# Patient Record
Sex: Male | Born: 1975
Health system: Southern US, Community
[De-identification: ages and names within clinical notes are randomized; demographics above are authoritative.]

## PROBLEM LIST (undated history)

## (undated) DIAGNOSIS — T7840XA Allergy, unspecified, initial encounter: Secondary | ICD-10-CM

## (undated) DIAGNOSIS — I10 Essential (primary) hypertension: Secondary | ICD-10-CM

## (undated) HISTORY — DX: Allergy, unspecified, initial encounter: T78.40XA

---

## 2014-12-10 ENCOUNTER — Emergency Department (HOSPITAL_COMMUNITY)
Admission: EM | Admit: 2014-12-10 | Discharge: 2014-12-10 | Disposition: A | Discharge status: Home / Self Care | Payer: Commercial Managed Care - PPO | Source: Home / Self Care | Attending: Family Medicine | Admitting: Family Medicine

## 2014-12-10 ENCOUNTER — Encounter (HOSPITAL_COMMUNITY): Payer: Self-pay | Admitting: Emergency Medicine

## 2014-12-10 DIAGNOSIS — S161XXA Strain of muscle, fascia and tendon at neck level, initial encounter: Secondary | ICD-10-CM | POA: Diagnosis not present

## 2014-12-10 MED ORDER — CYCLOBENZAPRINE HCL 10 MG PO TABS: 10.0000 mg | ORAL_TABLET | Freq: Every evening | ORAL | Status: DC | PRN

## 2014-12-10 MED ORDER — NAPROXEN 500 MG PO TABS: 500.0000 mg | ORAL_TABLET | Freq: Two times a day (BID) | ORAL | Status: DC

## 2014-12-10 NOTE — ED Provider Notes (Signed)
Nathan Garcia is a 39 y.o. male who presents to Urgent Care today for neck pain. Patient has a one month history of posterior neck pain. Pain started after he moved heavy furniture about a month ago. It's been worse for about a week. He's had similar pain in the past. No radiating pain weakness or numbness bowel bladder dysfunction or difficulty walking.   History reviewed. No pertinent past medical history. History reviewed. No pertinent past surgical history. History  Substance Use Topics  . Smoking status: Current Every Day Smoker -- 0.50 packs/day    Types: Cigarettes  . Smokeless tobacco: Not on file  . Alcohol Use: Yes   ROS as above Medications: No current facility-administered medications for this encounter.   Current Outpatient Prescriptions  Medication Sig Dispense Refill  . cyclobenzaprine (FLEXERIL) 10 MG tablet Take 1 tablet (10 mg total) by mouth at bedtime as needed for muscle spasms. 20 tablet 1  . naproxen (NAPROSYN) 500 MG tablet Take 1 tablet (500 mg total) by mouth 2 (two) times daily. 30 tablet 1   No Known Allergies   Exam:  BP 108/69 mmHg  Pulse 74  Temp(Src) 98.4 F (36.9 C) (Oral)  Resp 16  SpO2 98% Gen: Well NAD HEENT: EOMI,  MMM Lungs: Normal work of breathing. CTABL Heart: RRR no MRG Abd: NABS, Soft. Nondistended, Nontender Exts: Brisk capillary refill, warm and well perfused.  Neck: Nontender to spinal midline. Tender palpation bilateral trapezius and cervical paraspinals. Sliding normal neck range of motion Negative Spurling's test negative Upper extremity strength is intact throughout Reflexes are equal and intact and normal bilateral upper extremities.  No results found for this or any previous visit (from the past 24 hour(s)). No results found.  Assessment and Plan: 39 y.o. male with myofascial strain. Treat with naproxen, Flexeril, physical therapy. Follow up with sports medicine if not better.  Discussed warning signs or symptoms.  Please see discharge instructions. Patient expresses understanding.     Rodolph BongEvan S Melodye Swor, MD 12/10/14 [phone]

## 2014-12-10 NOTE — ED Notes (Signed)
C/o upper back/back of neck pain onset 1 week Reports he was moving heavy furniture Pain increases w/activity Denies weakness/numbness of extremities  Alert, no signs of acute distress.

## 2014-12-10 NOTE — Discharge Instructions (Signed)
Thank you for coming in today. Come back or go to the emergency room if you notice new weakness new numbness problems walking or bowel or bladder problems. Follow up with myself or Dr. Katrinka BlazingSmith.  Attend physical therapy.    Cervical Strain and Sprain  with Rehab Cervical strain and sprain are injuries that commonly occur with "whiplash" injuries. Whiplash occurs when the neck is forcefully whipped backward or forward, such as during a motor vehicle accident or during contact sports. The muscles, ligaments, tendons, discs, and nerves of the neck are susceptible to injury when this occurs. RISK FACTORS Risk of having a whiplash injury increases if:  Osteoarthritis of the spine.  Situations that make head or neck accidents or trauma more likely.  High-risk sports (football, rugby, wrestling, hockey, auto racing, gymnastics, diving, contact karate, or boxing).  Poor strength and flexibility of the neck.  Previous neck injury.  Poor tackling technique.  Improperly fitted or padded equipment. SYMPTOMS   Pain or stiffness in the front or back of neck or both.  Symptoms may present immediately or up to 24 hours after injury.  Dizziness, headache, nausea, and vomiting.  Muscle spasm with soreness and stiffness in the neck.  Tenderness and swelling at the injury site. PREVENTION  Learn and use proper technique (avoid tackling with the head, spearing, and head-butting; use proper falling techniques to avoid landing on the head).  Warm up and stretch properly before activity.  Maintain physical fitness:  Strength, flexibility, and endurance.  Cardiovascular fitness.  Wear properly fitted and padded protective equipment, such as padded soft collars, for participation in contact sports. PROGNOSIS  Recovery from cervical strain and sprain injuries is dependent on the extent of the injury. These injuries are usually curable in 1 week to 3 months with appropriate treatment.  RELATED  COMPLICATIONS   Temporary numbness and weakness may occur if the nerve roots are damaged, and this may persist until the nerve has completely healed.  Chronic pain due to frequent recurrence of symptoms.  Prolonged healing, especially if activity is resumed too soon (before complete recovery). TREATMENT  Treatment initially involves the use of ice and medication to help reduce pain and inflammation. It is also important to perform strengthening and stretching exercises and modify activities that worsen symptoms so the injury does not get worse. These exercises may be performed at home or with a therapist. For patients who experience severe symptoms, a soft, padded collar may be recommended to be worn around the neck.  Improving your posture may help reduce symptoms. Posture improvement includes pulling your chin and abdomen in while sitting or standing. If you are sitting, sit in a firm chair with your buttocks against the back of the chair. While sleeping, try replacing your pillow with a small towel rolled to 2 inches in diameter, or use a cervical pillow or soft cervical collar. Poor sleeping positions delay healing.  For patients with nerve root damage, which causes numbness or weakness, the use of a cervical traction apparatus may be recommended. Surgery is rarely necessary for these injuries. However, cervical strain and sprains that are present at birth (congenital) may require surgery. MEDICATION   If pain medication is necessary, nonsteroidal anti-inflammatory medications, such as aspirin and ibuprofen, or other minor pain relievers, such as acetaminophen, are often recommended.  Do not take pain medication for 7 days before surgery.  Prescription pain relievers may be given if deemed necessary by your caregiver. Use only as directed and only as much  as you need. HEAT AND COLD:   Cold treatment (icing) relieves pain and reduces inflammation. Cold treatment should be applied for 10 to 15  minutes every 2 to 3 hours for inflammation and pain and immediately after any activity that aggravates your symptoms. Use ice packs or an ice massage.  Heat treatment may be used prior to performing the stretching and strengthening activities prescribed by your caregiver, physical therapist, or athletic trainer. Use a heat pack or a warm soak. SEEK MEDICAL CARE IF:   Symptoms get worse or do not improve in 2 weeks despite treatment.  New, unexplained symptoms develop (drugs used in treatment may produce side effects). EXERCISES RANGE OF MOTION (ROM) AND STRETCHING EXERCISES - Cervical Strain and Sprain These exercises may help you when beginning to rehabilitate your injury. In order to successfully resolve your symptoms, you must improve your posture. These exercises are designed to help reduce the forward-head and rounded-shoulder posture which contributes to this condition. Your symptoms may resolve with or without further involvement from your physician, physical therapist or athletic trainer. While completing these exercises, remember:   Restoring tissue flexibility helps normal motion to return to the joints. This allows healthier, less painful movement and activity.  An effective stretch should be held for at least 20 seconds, although you may need to begin with shorter hold times for comfort.  A stretch should never be painful. You should only feel a gentle lengthening or release in the stretched tissue. STRETCH- Axial Extensors  Lie on your back on the floor. You may bend your knees for comfort. Place a rolled-up hand towel or dish towel, about 2 inches in diameter, under the part of your head that makes contact with the floor.  Gently tuck your chin, as if trying to make a "double chin," until you feel a gentle stretch at the base of your head.  Hold __________ seconds. Repeat __________ times. Complete this exercise __________ times per day.  STRETCH - Axial Extension   Stand or  sit on a firm surface. Assume a good posture: chest up, shoulders drawn back, abdominal muscles slightly tense, knees unlocked (if standing) and feet hip width apart.  Slowly retract your chin so your head slides back and your chin slightly lowers. Continue to look straight ahead.  You should feel a gentle stretch in the back of your head. Be certain not to feel an aggressive stretch since this can cause headaches later.  Hold for __________ seconds. Repeat __________ times. Complete this exercise __________ times per day. STRETCH - Cervical Side Bend   Stand or sit on a firm surface. Assume a good posture: chest up, shoulders drawn back, abdominal muscles slightly tense, knees unlocked (if standing) and feet hip width apart.  Without letting your nose or shoulders move, slowly tip your right / left ear to your shoulder until your feel a gentle stretch in the muscles on the opposite side of your neck.  Hold __________ seconds. Repeat __________ times. Complete this exercise __________ times per day. STRETCH - Cervical Rotators   Stand or sit on a firm surface. Assume a good posture: chest up, shoulders drawn back, abdominal muscles slightly tense, knees unlocked (if standing) and feet hip width apart.  Keeping your eyes level with the ground, slowly turn your head until you feel a gentle stretch along the back and opposite side of your neck.  Hold __________ seconds. Repeat __________ times. Complete this exercise __________ times per day. RANGE OF MOTION - Neck  Circles   Stand or sit on a firm surface. Assume a good posture: chest up, shoulders drawn back, abdominal muscles slightly tense, knees unlocked (if standing) and feet hip width apart.  Gently roll your head down and around from the back of one shoulder to the back of the other. The motion should never be forced or painful.  Repeat the motion 10-20 times, or until you feel the neck muscles relax and loosen. Repeat __________  times. Complete the exercise __________ times per day. STRENGTHENING EXERCISES - Cervical Strain and Sprain These exercises may help you when beginning to rehabilitate your injury. They may resolve your symptoms with or without further involvement from your physician, physical therapist, or athletic trainer. While completing these exercises, remember:   Muscles can gain both the endurance and the strength needed for everyday activities through controlled exercises.  Complete these exercises as instructed by your physician, physical therapist, or athletic trainer. Progress the resistance and repetitions only as guided.  You may experience muscle soreness or fatigue, but the pain or discomfort you are trying to eliminate should never worsen during these exercises. If this pain does worsen, stop and make certain you are following the directions exactly. If the pain is still present after adjustments, discontinue the exercise until you can discuss the trouble with your clinician. STRENGTH - Cervical Flexors, Isometric  Face a wall, standing about 6 inches away. Place a small pillow, a ball about 6-8 inches in diameter, or a folded towel between your forehead and the wall.  Slightly tuck your chin and gently push your forehead into the soft object. Push only with mild to moderate intensity, building up tension gradually. Keep your jaw and forehead relaxed.  Hold 10 to 20 seconds. Keep your breathing relaxed.  Release the tension slowly. Relax your neck muscles completely before you start the next repetition. Repeat __________ times. Complete this exercise __________ times per day. STRENGTH- Cervical Lateral Flexors, Isometric   Stand about 6 inches away from a wall. Place a small pillow, a ball about 6-8 inches in diameter, or a folded towel between the side of your head and the wall.  Slightly tuck your chin and gently tilt your head into the soft object. Push only with mild to moderate intensity,  building up tension gradually. Keep your jaw and forehead relaxed.  Hold 10 to 20 seconds. Keep your breathing relaxed.  Release the tension slowly. Relax your neck muscles completely before you start the next repetition. Repeat __________ times. Complete this exercise __________ times per day. STRENGTH - Cervical Extensors, Isometric   Stand about 6 inches away from a wall. Place a small pillow, a ball about 6-8 inches in diameter, or a folded towel between the back of your head and the wall.  Slightly tuck your chin and gently tilt your head back into the soft object. Push only with mild to moderate intensity, building up tension gradually. Keep your jaw and forehead relaxed.  Hold 10 to 20 seconds. Keep your breathing relaxed.  Release the tension slowly. Relax your neck muscles completely before you start the next repetition. Repeat __________ times. Complete this exercise __________ times per day. POSTURE AND BODY MECHANICS CONSIDERATIONS - Cervical Strain and Sprain Keeping correct posture when sitting, standing or completing your activities will reduce the stress put on different body tissues, allowing injured tissues a chance to heal and limiting painful experiences. The following are general guidelines for improved posture. Your physician or physical therapist will provide you with  any instructions specific to your needs. While reading these guidelines, remember:  The exercises prescribed by your provider will help you have the flexibility and strength to maintain correct postures.  The correct posture provides the optimal environment for your joints to work. All of your joints have less wear and tear when properly supported by a spine with good posture. This means you will experience a healthier, less painful body.  Correct posture must be practiced with all of your activities, especially prolonged sitting and standing. Correct posture is as important when doing repetitive low-stress  activities (typing) as it is when doing a single heavy-load activity (lifting). PROLONGED STANDING WHILE SLIGHTLY LEANING FORWARD When completing a task that requires you to lean forward while standing in one place for a long time, place either foot up on a stationary 2- to 4-inch high object to help maintain the best posture. When both feet are on the ground, the low back tends to lose its slight inward curve. If this curve flattens (or becomes too large), then the back and your other joints will experience too much stress, fatigue more quickly, and can cause pain.  RESTING POSITIONS Consider which positions are most painful for you when choosing a resting position. If you have pain with flexion-based activities (sitting, bending, stooping, squatting), choose a position that allows you to rest in a less flexed posture. You would want to avoid curling into a fetal position on your side. If your pain worsens with extension-based activities (prolonged standing, working overhead), avoid resting in an extended position such as sleeping on your stomach. Most people will find more comfort when they rest with their spine in a more neutral position, neither too rounded nor too arched. Lying on a non-sagging bed on your side with a pillow between your knees, or on your back with a pillow under your knees will often provide some relief. Keep in mind, being in any one position for a prolonged period of time, no matter how correct your posture, can still lead to stiffness. WALKING Walk with an upright posture. Your ears, shoulders, and hips should all line up. OFFICE WORK When working at a desk, create an environment that supports good, upright posture. Without extra support, muscles fatigue and lead to excessive strain on joints and other tissues. CHAIR:  A chair should be able to slide under your desk when your back makes contact with the back of the chair. This allows you to work closely.  The chair's height  should allow your eyes to be level with the upper part of your monitor and your hands to be slightly lower than your elbows.  Body position:  Your feet should make contact with the floor. If this is not possible, use a foot rest.  Keep your ears over your shoulders. This will reduce stress on your neck and low back. Document Released: 06/20/2005 Document Revised: 11/04/2013 Document Reviewed: 10/02/2008 [facility] Patient Information 2015 [place], [place]. This information is not intended to replace advice given to you by your health care provider. Make sure you discuss any questions you have with your health care provider.

## 2015-08-03 ENCOUNTER — Encounter (HOSPITAL_COMMUNITY): Payer: Self-pay | Admitting: Emergency Medicine

## 2015-08-03 ENCOUNTER — Emergency Department (HOSPITAL_COMMUNITY)
Admission: EM | Admit: 2015-08-03 | Discharge: 2015-08-03 | Disposition: A | Discharge status: Home / Self Care | Payer: Commercial Managed Care - PPO | Source: Home / Self Care | Attending: Family Medicine | Admitting: Family Medicine

## 2015-08-03 DIAGNOSIS — R0981 Nasal congestion: Secondary | ICD-10-CM

## 2015-08-03 DIAGNOSIS — J069 Acute upper respiratory infection, unspecified: Secondary | ICD-10-CM | POA: Diagnosis not present

## 2015-08-03 MED ORDER — AMOXICILLIN 500 MG PO CAPS: 500.0000 mg | ORAL_CAPSULE | Freq: Three times a day (TID) | ORAL | Status: DC

## 2015-08-03 MED ORDER — NYSTATIN 100000 UNIT/ML MT SUSP: 500000.0000 [IU] | Freq: Four times a day (QID) | OROMUCOSAL | Status: DC

## 2015-08-03 NOTE — Discharge Instructions (Signed)
Upper Respiratory Infection, Adult Most upper respiratory infections (URIs) are a viral infection of the air passages leading to the lungs. A URI affects the nose, throat, and upper air passages. The most common type of URI is nasopharyngitis and is typically referred to as "the common cold." URIs run their course and usually go away on their own. Most of the time, a URI does not require medical attention, but sometimes a bacterial infection in the upper airways can follow a viral infection. This is called a secondary infection. Sinus and middle ear infections are common types of secondary upper respiratory infections. Bacterial pneumonia can also complicate a URI. A URI can worsen asthma and chronic obstructive pulmonary disease (COPD). Sometimes, these complications can require emergency medical care and may be life threatening.  CAUSES Almost all URIs are caused by viruses. A virus is a type of germ and can spread from one person to another.  RISKS FACTORS You may be at risk for a URI if:   You smoke.   You have chronic heart or lung disease.  You have a weakened defense (immune) system.   You are very young or very old.   You have nasal allergies or asthma.  You work in crowded or poorly ventilated areas.  You work in health care facilities or schools. SIGNS AND SYMPTOMS  Symptoms typically develop 2-3 days after you come in contact with a cold virus. Most viral URIs last 7-10 days. However, viral URIs from the influenza virus (flu virus) can last 14-18 days and are typically more severe. Symptoms may include:   Runny or stuffy (congested) nose.   Sneezing.   Cough.   Sore throat.   Headache.   Fatigue.   Fever.   Loss of appetite.   Pain in your forehead, behind your eyes, and over your cheekbones (sinus pain).  Muscle aches.  DIAGNOSIS  Your health care provider may diagnose a URI by:  Physical exam.  Tests to check that your symptoms are not due to  another condition such as:  Strep throat.  Sinusitis.  Pneumonia.  Asthma. TREATMENT  A URI goes away on its own with time. It cannot be cured with medicines, but medicines may be prescribed or recommended to relieve symptoms. Medicines may help:  Reduce your fever.  Reduce your cough.  Relieve nasal congestion. HOME CARE INSTRUCTIONS   Take medicines only as directed by your health care provider.   Gargle warm saltwater or take cough drops to comfort your throat as directed by your health care provider.  Use a warm mist humidifier or inhale steam from a shower to increase air moisture. This may make it easier to breathe.  Drink enough fluid to keep your urine clear or pale yellow.   Eat soups and other clear broths and maintain good nutrition.   Rest as needed.   Return to work when your temperature has returned to normal or as your health care provider advises. You may need to stay home longer to avoid infecting others. You can also use a face mask and careful hand washing to prevent spread of the virus.  Increase the usage of your inhaler if you have asthma.   Do not use any tobacco products, including cigarettes, chewing tobacco, or electronic cigarettes. If you need help quitting, ask your health care provider. PREVENTION  The best way to protect yourself from getting a cold is to practice good hygiene.   Avoid oral or hand contact with people with cold   symptoms.   Wash your hands often if contact occurs.  There is no clear evidence that vitamin C, vitamin E, echinacea, or exercise reduces the chance of developing a cold. However, it is always recommended to get plenty of rest, exercise, and practice good nutrition.  SEEK MEDICAL CARE IF:   You are getting worse rather than better.   Your symptoms are not controlled by medicine.   You have chills.  You have worsening shortness of breath.  You have brown or red mucus.  You have yellow or brown nasal  discharge.  You have pain in your face, especially when you bend forward.  You have a fever.  You have swollen neck glands.  You have pain while swallowing.  You have white areas in the back of your throat. SEEK IMMEDIATE MEDICAL CARE IF:   You have severe or persistent:  Headache.  Ear pain.  Sinus pain.  Chest pain.  You have chronic lung disease and any of the following:  Wheezing.  Prolonged cough.  Coughing up blood.  A change in your usual mucus.  You have a stiff neck.  You have changes in your:  Vision.  Hearing.  Thinking.  Mood. MAKE SURE YOU:   Understand these instructions.  Will watch your condition.  Will get help right away if you are not doing well or get worse.   This information is not intended to replace advice given to you by your health care provider. Make sure you discuss any questions you have with your health care provider.   Document Released: 12/14/2000 Document Revised: 11/04/2014 Document Reviewed: 09/25/2013 [employer] Interactive Patient Education 2016 [employer]  

## 2015-08-03 NOTE — ED Provider Notes (Signed)
CSN: [id number]     Arrival date & time 08/03/15  1349 History   First MD Initiated Contact with Patient 08/03/15 1530     Chief Complaint  Patient presents with  . Sinus Problem   (Consider location/radiation/quality/duration/timing/severity/associated sxs/prior Treatment) HPI  History reviewed. No pertinent past medical history. History reviewed. No pertinent past surgical history. History reviewed. No pertinent family history. Social History  Substance Use Topics  . Smoking status: Current Every Day Smoker -- 0.50 packs/day    Types: Cigarettes  . Smokeless tobacco: None  . Alcohol Use: Yes    Review of Systems  Allergies  Review of patient's allergies indicates no known allergies.  Home Medications   Prior to Admission medications   Medication Sig Start Date End Date Taking? Authorizing Provider  amoxicillin (AMOXIL) 500 MG capsule Take 1 capsule (500 mg total) by mouth 3 (three) times daily. 08/03/15   Tharon Aquas, PA  cyclobenzaprine (FLEXERIL) 10 MG tablet Take 1 tablet (10 mg total) by mouth at bedtime as needed for muscle spasms. 12/10/14   Rodolph Bong, MD  naproxen (NAPROSYN) 500 MG tablet Take 1 tablet (500 mg total) by mouth 2 (two) times daily. 12/10/14   Rodolph Bong, MD   Meds Ordered and Administered this Visit  Medications - No data to display  BP 141/95 mmHg  Pulse 76  Temp(Src) 98.2 F (36.8 C) (Oral)  Resp 16  SpO2 100% No data found.   Physical Exam  Constitutional: He is oriented to person, place, and time. He appears well-developed and well-nourished. No distress.  HENT:  Head: Normocephalic and atraumatic.  Right Ear: External ear normal.  Left Ear: External ear normal.  Mouth/Throat: Oropharynx is clear and moist.  Eyes: Conjunctivae are normal.  Neck: Normal range of motion. Neck supple.  Cardiovascular: Normal rate.   Pulmonary/Chest: Effort normal and breath sounds normal.  Abdominal: Soft. Bowel sounds are normal.   Musculoskeletal: Normal range of motion.  Neurological: He is alert and oriented to person, place, and time.  Skin: Skin is warm and dry.  Psychiatric: He has a normal mood and affect. His behavior is normal. Judgment and thought content normal.  Nursing note and vitals reviewed.   ED Course  Procedures (including critical care time)  Labs Review Labs Reviewed - No data to display  Imaging Review No results found.   Visual Acuity Review  Right Eye Distance:   Left Eye Distance:   Bilateral Distance:    Right Eye Near:   Left Eye Near:    Bilateral Near:         MDM   1. Acute URI   2. Sinus congestion    Patient is advised to continue home symptomatic treatment. Prescription for amoxicillin sent pharmacy patient has indicated. Patient is advised that if there are new or worsening symptoms or attend the emergency department, or contact primary care provider. Instructions of care provided discharged home in stable condition. Return to work note provided.  THIS NOTE WAS GENERATED USING A VOICE RECOGNITION SOFTWARE PROGRAM. ALL REASONABLE EFFORTS  WERE MADE TO PROOFREAD THIS DOCUMENT FOR ACCURACY.     Tharon Aquas, PA 08/03/15 [phone]

## 2015-08-03 NOTE — ED Notes (Signed)
Pt has been suffering from sinus congestion and pain since last Thursday.  Pt had a fever for three days.  Pt now with a cough, sinus congestion, and ear fullness.

## 2016-12-16 ENCOUNTER — Ambulatory Visit (HOSPITAL_COMMUNITY)
Admission: EM | Admit: 2016-12-16 | Discharge: 2016-12-16 | Disposition: A | Discharge status: Home / Self Care | Payer: Commercial Managed Care - PPO | Attending: Internal Medicine | Admitting: Internal Medicine

## 2016-12-16 ENCOUNTER — Encounter (HOSPITAL_COMMUNITY): Payer: Self-pay | Admitting: *Deleted

## 2016-12-16 DIAGNOSIS — L02411 Cutaneous abscess of right axilla: Secondary | ICD-10-CM | POA: Diagnosis present

## 2016-12-16 DIAGNOSIS — F17210 Nicotine dependence, cigarettes, uncomplicated: Secondary | ICD-10-CM | POA: Insufficient documentation

## 2016-12-16 DIAGNOSIS — L03111 Cellulitis of right axilla: Secondary | ICD-10-CM | POA: Diagnosis not present

## 2016-12-16 MED ORDER — CEPHALEXIN 500 MG PO CAPS: 500.0000 mg | ORAL_CAPSULE | Freq: Four times a day (QID) | ORAL | 0 refills | Status: DC

## 2016-12-16 MED ORDER — SULFAMETHOXAZOLE-TRIMETHOPRIM 800-160 MG PO TABS: 1.0000 | ORAL_TABLET | Freq: Two times a day (BID) | ORAL | 0 refills | Status: AC

## 2016-12-16 NOTE — ED Triage Notes (Signed)
Noticed   A  Bump   Under  Armpit    It  Is  Red   And  Tender  To  The  Touch     Noticed   2  Days  Ago

## 2016-12-16 NOTE — ED Provider Notes (Signed)
CSN: [id number]     Arrival date & time 12/16/16  1210 History   First MD Initiated Contact with Patient 12/16/16 1235     Chief Complaint  Patient presents with  . Abscess   (Consider location/radiation/quality/duration/timing/severity/associated sxs/prior Treatment) 41-year-old male states he noticed a small tender bump to the left right axilla 2 days ago. During a time it is increased in size substantially and is now tender There is also noticed detaining is erythema extending beyond the axilla to the anterior right shoulder. There is been no draining or bleeding.       History reviewed. No pertinent past medical history. History reviewed. No pertinent surgical history. History reviewed. No pertinent family history. Social History  Substance Use Topics  . Smoking status: Current Every Day Smoker    Packs/day: 0.50    Types: Cigarettes  . Smokeless tobacco: Not on file  . Alcohol use Yes    Review of Systems  Constitutional: Negative.   Gastrointestinal: Negative.   Musculoskeletal: Negative.   Skin:       As per history of present illness  Neurological: Negative.   All other systems reviewed and are negative.   Allergies  Patient has no known allergies.  Home Medications   Prior to Admission medications   Medication Sig Start Date End Date Taking? Authorizing Provider  cephALEXin (KEFLEX) 500 MG capsule Take 1 capsule (500 mg total) by mouth 4 (four) times daily. 12/16/16   Hayden Rasmussen, NP  sulfamethoxazole-trimethoprim (BACTRIM DS,SEPTRA DS) 800-160 MG tablet Take 1 tablet by mouth 2 (two) times daily. 12/16/16 12/23/16  Hayden Rasmussen, NP   Meds Ordered and Administered this Visit  Medications - No data to display  BP (!) 160/14 (BP Location: Right Arm)   Pulse 78   Temp 98.7 F (37.1 C) (Oral)   Resp 18   SpO2 100%  No data found.   Physical Exam  Constitutional: He is oriented to person, place, and time. He appears well-developed and well-nourished. No  distress.  Neck: Neck supple.  Cardiovascular: Normal rate.   Pulmonary/Chest: Effort normal.  Lymphadenopathy:    He has no cervical adenopathy.  Neurological: He is alert and oriented to person, place, and time.  Skin: Skin is warm and dry.  Visit approximately 1 cm x 3 cm elongated area of firmness and tenderness with underlying induration extending proximally half a centimeter on each side. Cutaneous erythema extends away from the wound toward the anterior shoulder.  Psychiatric: He has a normal mood and affect.  Nursing note and vitals reviewed.   Urgent Care Course   the right abscess lesion was prepped with Betadine. Anesthesia was cold/freeze spray. A single puncture wound by 11 blade was created. It manual expression revealed a small amount of pus that was cultured. Following that was a small amount of blood. The area was cleaned again with Betadine and a dressing applied.  Procedures (including critical care time)  Labs Review Labs Reviewed  AEROBIC CULTURE (SUPERFICIAL SPECIMEN)    Imaging Review No results found.   Visual Acuity Review  Right Eye Distance:   Left Eye Distance:   Bilateral Distance:    Right Eye Near:   Left Eye Near:    Bilateral Near:         MDM   1. Abscess of axilla, right   2. Cellulitis of axilla, right    You may keep the area clean under a shower with soap and water. Be sure to dry the  area well. Apply warm compresses 3-4 times a day if possible for 20-30 minutes at a time. There will be some bleeding or drainage for the next couple days. Take the antibiotics as directed. For any worsening, the abscess getting larger, more painful or the redness is spreading return promptly. Dressing applied Culture pending Meds ordered this encounter  Medications  . sulfamethoxazole-trimethoprim (BACTRIM DS,SEPTRA DS) 800-160 MG tablet    Sig: Take 1 tablet by mouth 2 (two) times daily.    Dispense:  14 tablet    Refill:  0    Order  Specific Question:   Supervising Provider    Answer:   Eustace MooreMURRAY, LAURA W [[id number]]  . cephALEXin (KEFLEX) 500 MG capsule    Sig: Take 1 capsule (500 mg total) by mouth 4 (four) times daily.    Dispense:  28 capsule    Refill:  0    Order Specific Question:   Supervising Provider    Answer:   Eustace MooreMURRAY, LAURA W [[id number]]       Hayden RasmussenMabe, Rapheal Masso, NP 12/16/16 1309    Hayden RasmussenMabe, Nahsir Venezia, NP 12/16/16 1315

## 2016-12-16 NOTE — Discharge Instructions (Signed)
You may keep the area clean under a shower with soap and water. Be sure to dry the area well. Apply warm compresses 3-4 times a day if possible for 20-30 minutes at a time. There will be some bleeding or drainage for the next couple days. Take the antibiotics as directed. For any worsening, the abscess getting larger, more painful or the redness is spreading return promptly.

## 2016-12-18 LAB — AEROBIC CULTURE  (SUPERFICIAL SPECIMEN)

## 2016-12-18 LAB — AEROBIC CULTURE W GRAM STAIN (SUPERFICIAL SPECIMEN)

## 2016-12-21 ENCOUNTER — Encounter: Payer: Self-pay | Admitting: Osteopathic Medicine

## 2016-12-21 ENCOUNTER — Ambulatory Visit (INDEPENDENT_AMBULATORY_CARE_PROVIDER_SITE_OTHER): Payer: Commercial Managed Care - PPO | Admitting: Osteopathic Medicine

## 2016-12-21 VITALS — BP 150/84 | HR 109 | Temp 98.9°F | Ht 74.0 in | Wt 298.0 lb

## 2016-12-21 DIAGNOSIS — I10 Essential (primary) hypertension: Secondary | ICD-10-CM | POA: Diagnosis not present

## 2016-12-21 DIAGNOSIS — L02411 Cutaneous abscess of right axilla: Secondary | ICD-10-CM

## 2016-12-21 MED ORDER — LOSARTAN POTASSIUM 25 MG PO TABS: 25.0000 mg | ORAL_TABLET | Freq: Every day | ORAL | 1 refills | Status: DC

## 2016-12-21 NOTE — Patient Instructions (Addendum)
Plan:  Starting low-dose BP medication   Goal BP 130/80 or less  Labs as well for baseline and check cholesterol, other routine  Will plan on labs in another few weeks to monitor on meds

## 2016-12-21 NOTE — Progress Notes (Signed)
HPI: Nathan Garcia is a 41 y.o. male  who presents to [facility] today, 12/21/16,  for chief complaint of:  Chief Complaint  Patient presents with  . Establish Care    FOLLOW UP FROM URGENT CARE ABCESS UNDER RIGHT ARM    Records reviewed from urgent care: Seen 5 days ago, treated for right axillary abscess and cellulitis , incision and drainage was performed, patient was treated with Bactrim and Keflex. Culture was positive for moderate white blood cells predominantly PMN, few actinomyces species.   Hypertension: Blood pressure in urgent care was systolic 160, diastolic is listed as 14 and I assume this is a typographic error, ot thinks was 110. Elevated today on recheck as well. Looks like has been trending up over the past 2 years or so. Patient reports weight gain over that time, evening cocktails and social drinking conservative estimate about 12 drinks per week, current smoker.    Past medical, surgical, social and family history reviewed: There are no active problems to display for this patient.  History reviewed. No pertinent surgical history. Social History  Substance Use Topics  . Smoking status: Current Every Day Smoker    Packs/day: 0.50    Types: Cigarettes  . Smokeless tobacco: Never Used  . Alcohol use Yes   History reviewed. No pertinent family history.   Current medication list and allergy/intolerance information reviewed:   Current Outpatient Prescriptions  Medication Sig Dispense Refill  . cephALEXin (KEFLEX) 500 MG capsule Take 1 capsule (500 mg total) by mouth 4 (four) times daily. 28 capsule 0  . sulfamethoxazole-trimethoprim (BACTRIM DS,SEPTRA DS) 800-160 MG tablet Take 1 tablet by mouth 2 (two) times daily. 14 tablet 0   No current facility-administered medications for this visit.    No Known Allergies    Review of Systems:  Constitutional:  No  fever, no chills, No recent illness, No unintentional weight changes.  No significant fatigue.   HEENT: No  headache, no vision change, no hearing change, No sore throat, No  sinus pressure  Cardiac: No  chest pain, No  pressure, No palpitations, No  Orthopnea  Respiratory:  No  shortness of breath. No  Cough  Gastrointestinal: No  abdominal pain, No  nausea, No  vomiting,  No  blood in stool, No  diarrhea, No  constipation   Musculoskeletal: No new myalgia/arthralgia  Genitourinary: No  incontinence  Skin: No  Rash, No other wounds/concerning lesions  Hem/Onc: No  easy bruising/bleeding  Endocrine: No cold intolerance,  No heat intolerance  Neurologic: No  weakness, No  dizziness  Psychiatric: No  concerns with depression, No  concerns with anxiety, No sleep problems, No mood problems  Exam:  BP (!) 150/84   Pulse (!) 109   Temp 98.9 F (37.2 C) (Oral)   Ht 6\' 2"  (1.88 m)   Wt 298 lb (135.2 kg)   BMI 38.26 kg/m   Constitutional: VS see above. General Appearance: alert, well-developed, well-nourished, NAD  Eyes: Normal lids and conjunctive, non-icteric sclera  Ears, Nose, Mouth, Throat: MMM, Normal external inspection ears/nares/mouth/lips/gums. TM normal bilaterally. Pharynx/tonsils no erythema, no exudate. Nasal mucosa normal.   Neck: No masses, trachea midline. No thyroid enlargement. No tenderness/mass appreciated. No lymphadenopathy  Respiratory: Normal respiratory effort. no wheeze, no rhonchi, no rales  Cardiovascular: S1/S2 normal, no murmur, no rub/gallop auscultated. RRR on ausculatation. No lower extremity edema.   Musculoskeletal: Gait normal. No clubbing/cyanosis of digits.   Neurological: Normal balance/coordination. No tremor.  Skin: warm, dry, intact. Right axilla demonstrates small indurated lesion with healing laceration consistent with previous incision and drainage. No significant erythema/fluctuance/tenderness.  Psychiatric: Normal judgment/insight. Normal mood and affect. Oriented x3.     ASSESSMENT/PLAN:    Essential hypertension - Discussed prevention measures and risk reduction, lifestyle modification with particular regard to weight, drinking, smoking. - Plan: losartan (COZAAR) 25 MG tablet, CBC, COMPLETE METABOLIC PANEL WITH GFR, Lipid panel, TSH  Abscess of right axilla - Healing appropriately. Klebsiella an unusual pathogen but may have been passed by feline. Since no deep abscess, okay to complete course of abx and monitor    Patient Instructions  Plan:  Starting low-dose BP medication   Goal BP 130/80 or less  Labs as well for baseline and check cholesterol, other routine  Will plan on labs in another few weeks to monitor on meds     Visit summary with medication list and pertinent instructions was printed for patient to review. All questions at time of visit were answered - patient instructed to contact office with any additional concerns. ER/RTC precautions were reviewed with the patient. Follow-up plan: Return in about 2 weeks (around 01/04/2017) for recheck BP on new medicine.

## 2016-12-22 DIAGNOSIS — L02411 Cutaneous abscess of right axilla: Secondary | ICD-10-CM | POA: Insufficient documentation

## 2016-12-22 DIAGNOSIS — I10 Essential (primary) hypertension: Secondary | ICD-10-CM | POA: Insufficient documentation

## 2017-01-06 ENCOUNTER — Encounter: Payer: Self-pay | Admitting: Cardiovascular Disease

## 2017-01-06 ENCOUNTER — Ambulatory Visit (INDEPENDENT_AMBULATORY_CARE_PROVIDER_SITE_OTHER): Payer: Commercial Managed Care - PPO | Admitting: Cardiovascular Disease

## 2017-01-06 VITALS — BP 141/98 | HR 68 | Ht 74.0 in | Wt 300.6 lb

## 2017-01-06 DIAGNOSIS — G4733 Obstructive sleep apnea (adult) (pediatric): Secondary | ICD-10-CM | POA: Diagnosis not present

## 2017-01-06 DIAGNOSIS — I10 Essential (primary) hypertension: Secondary | ICD-10-CM | POA: Diagnosis not present

## 2017-01-06 HISTORY — DX: Obstructive sleep apnea (adult) (pediatric): G47.33

## 2017-01-06 NOTE — Assessment & Plan Note (Signed)
Recently recognized/diagnosed hypertension his recent ER visit with blood pressure measured at 141/98. He was given a prescription for low-dose losartan 25 mg which has yet to fill. He does admit to dietary indiscretion with regard to salt. He also gives symptoms compatible with obstructive sleep apnea. I've encouraged him to limit his salt intake, fill his prescription for losartan and check his blood pressure daily. He will keep a blood pressure log and will see Belenda CruiseKristin back in one month to review his blood pressure readings and his labs, to make further recommendations with regards to medications and titration.

## 2017-01-06 NOTE — Patient Instructions (Signed)
Medication Instructions: Your physician recommends that you continue on your current medications as directed. Please refer to the Current Medication list given to you today.  Labwork: Your physician recommends that you return for lab work in: 2 weeks after starting Losartan   Testing/Procedures: Your physician has recommended that you have a sleep study. This test records several body functions during sleep, including: brain activity, eye movement, oxygen and carbon dioxide blood levels, heart rate and rhythm, breathing rate and rhythm, the flow of air through your mouth and nose, snoring, body muscle movements, and chest and belly movement.  Follow-Up: Your physician recommends that you schedule a follow-up appointment in: 1 month with [facility]. Your physician has requested that you regularly monitor and record your blood pressure readings at home. Please use the same machine at the same time of day to check your readings and record them to bring to your follow-up visit. Nathan Finland([place])  Your physician recommends that you schedule a follow-up appointment in: 3 months with Dr. Allyson SabalBerry.  If you need a refill on your cardiac medications before your next appointment, please call your pharmacy.

## 2017-01-06 NOTE — Assessment & Plan Note (Signed)
Mr. Nathan Garcia gives symptoms of obstructive sleep apnea with nocturnal and oriented times months. He does have a body habitus consistent with this diagnosis. His BMI is 38. I'm going to obtain a outpatient sleep study to further evaluate.

## 2017-01-06 NOTE — Progress Notes (Signed)
01/06/2017 Nathan Garcia   [birth year]  [id number]  Primary Physician Sunnie Nielsen, DO Primary Cardiologist: Runell Gess MD Roseanne Reno  HPI:  Nathan Garcia is a pleasant 41 year old moderately overweight Caucasian male with no children. 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 He was referred by Wyline Copas for coronary vessel evaluation and treatment of his hypertension. His cardiac risk factors profile is notable for tobacco abuse having smoked one half pack per day for the last 25 years. He is unaware of his lipid profile. He is not diabetic. He does have newly recognized hypertension. There is no family history. He denies chest pain or shortness of breath. He never had a heart attack or stroke. He does admit to dietary indiscretion with regards to salt as well as symptoms compatible with obstructive sleep apnea.   Current Outpatient Prescriptions  Medication Sig Dispense Refill  . cephALEXin (KEFLEX) 500 MG capsule Take 1 capsule (500 mg total) by mouth 4 (four) times daily. 28 capsule 0  . losartan (COZAAR) 25 MG tablet Take 1 tablet (25 mg total) by mouth daily. (Patient not taking: Reported on 01/06/2017) 30 tablet 1   No current facility-administered medications for this visit.     No Known Allergies  Social History   Social History  . Marital status: Single    Spouse name: N/A  . Number of children: N/A  . Years of education: N/A   Occupational History  . Not on file.   Social History Main Topics  . Smoking status: Current Every Day Smoker    Packs/day: 0.50    Types: Cigarettes  . Smokeless tobacco: Never Used  . Alcohol use Yes  . Drug use: No  . Sexual activity: Not on file   Other Topics Concern  . Not on file   Social History Narrative  . No narrative on file     Review of Systems: General: negative for chills, fever, night sweats or weight changes.  Cardiovascular: negative for chest pain, dyspnea on exertion,  edema, orthopnea, palpitations, paroxysmal nocturnal dyspnea or shortness of breath Dermatological: negative for rash Respiratory: negative for cough or wheezing Urologic: negative for hematuria Abdominal: negative for nausea, vomiting, diarrhea, bright red blood per rectum, melena, or hematemesis Neurologic: negative for visual changes, syncope, or dizziness All other systems reviewed and are otherwise negative except as noted above.    Blood pressure (!) 141/98, pulse 68, height 6\' 2"  (1.88 m), weight (!) 300 lb 9.6 oz (136.4 kg).  General appearance: alert and no distress Neck: no adenopathy, no carotid bruit, no JVD, supple, symmetrical, trachea midline and thyroid not enlarged, symmetric, no tenderness/mass/nodules Lungs: clear to auscultation bilaterally Heart: regular rate and rhythm, S1, S2 normal, no murmur, click, rub or gallop Extremities: extremities normal, atraumatic, no cyanosis or edema  EKG sinus rhythm at 68 without ST or T-wave changes. I personally reviewed this EKG.  ASSESSMENT AND PLAN:   Essential hypertension Recently recognized/diagnosed hypertension his recent ER visit with blood pressure measured at 141/98. He was given a prescription for low-dose losartan 25 mg which has yet to fill. He does admit to dietary indiscretion with regard to salt. He also gives symptoms compatible with obstructive sleep apnea. I've encouraged him to limit his salt intake, fill his prescription for losartan and check his blood pressure daily. He will keep a blood pressure log and will see Belenda Cruise back in one month to review his blood pressure readings and  his labs, to make further recommendations with regards to medications and titration.  Obstructive sleep apnea Mr. Conard gives symptoms of obstructive sleep apnea with nocturnal and oriented times months. He does have a body habitus consistent with this diagnosis. His BMI is 38. I'm going to obtain a outpatient sleep study to further  evaluate.      Runell GessJonathan J. Kristian Hazzard MD FACP,FACC,FAHA, [facility] 01/06/2017 9:56 AM

## 2017-01-11 ENCOUNTER — Ambulatory Visit: Payer: Commercial Managed Care - PPO | Admitting: Osteopathic Medicine

## 2017-01-11 DIAGNOSIS — Z0189 Encounter for other specified special examinations: Secondary | ICD-10-CM

## 2017-02-06 ENCOUNTER — Ambulatory Visit: Payer: Commercial Managed Care - PPO

## 2017-03-03 ENCOUNTER — Encounter (HOSPITAL_BASED_OUTPATIENT_CLINIC_OR_DEPARTMENT_OTHER): Payer: Commercial Managed Care - PPO

## 2017-04-18 ENCOUNTER — Ambulatory Visit: Payer: Commercial Managed Care - PPO | Admitting: Cardiovascular Disease

## 2017-07-14 ENCOUNTER — Encounter: Payer: Self-pay | Admitting: Cardiovascular Disease

## 2017-07-14 ENCOUNTER — Ambulatory Visit: Payer: Commercial Managed Care - PPO | Admitting: Cardiovascular Disease

## 2017-07-14 ENCOUNTER — Encounter (INDEPENDENT_AMBULATORY_CARE_PROVIDER_SITE_OTHER): Payer: Self-pay

## 2017-07-14 VITALS — BP 163/104 | HR 92 | Ht 74.0 in | Wt 300.8 lb

## 2017-07-14 DIAGNOSIS — G4733 Obstructive sleep apnea (adult) (pediatric): Secondary | ICD-10-CM | POA: Diagnosis not present

## 2017-07-14 DIAGNOSIS — I10 Essential (primary) hypertension: Secondary | ICD-10-CM | POA: Diagnosis not present

## 2017-07-14 DIAGNOSIS — Z13220 Encounter for screening for lipoid disorders: Secondary | ICD-10-CM | POA: Diagnosis not present

## 2017-07-14 MED ORDER — LOSARTAN POTASSIUM 50 MG PO TABS: 50.0000 mg | ORAL_TABLET | Freq: Every day | ORAL | 6 refills | Status: DC

## 2017-07-14 MED ORDER — NEBIVOLOL HCL 5 MG PO TABS: 5.0000 mg | ORAL_TABLET | Freq: Every day | ORAL | 6 refills | Status: DC

## 2017-07-14 NOTE — Assessment & Plan Note (Signed)
Mr. Nathan Garcia has classic symptoms of obstructive sleep apnea with daytime somnolence and snoring. I'm going to obtain a outpatient sleep study to rule out obstructive sleep apnea.

## 2017-07-14 NOTE — Assessment & Plan Note (Signed)
History of essential hypertension. Blood pressure measured 163/104. He had partially has not filled any prescriptions which I prescribed since his last office visit nor has he complied with salt restriction. We talked about the importance of dietary modification. I am going to begin him on losartan 50 mg a day and Bystolic  5 mg a day. He will obtain a digital blood pressure cuff and keep a blood pressure log over the next 30 days. He'll see Belenda CruiseKristin in the office at that time to review his findings and titrate his medications.

## 2017-07-14 NOTE — Progress Notes (Signed)
07/14/2017 Abdiaziz Easton   1975/08/24  [id number]  Primary Physician Sunnie Nielsen, DO Primary Cardiologist: Runell Gess MD FACP, [place], [place], [place]  HPI:  Kendarious Gudino is a 42 y.o.  moderately overweight Caucasian male with no children. 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 He was referred by Wyline Copas for coronary vessel evaluation and treatment of his hypertension. I last saw him in the office 01/06/17. His cardiac risk factors profile is notable for tobacco abuse having smoked one half pack per day for the last 25 years. He is unaware of his lipid profile. He is not diabetic. He does have newly recognized hypertension. There is no family history. He denies chest pain or shortness of breath. He never had a heart attack or stroke. He does admit to dietary indiscretion with regards to salt as well as symptoms compatible with obstructive sleep apnea. Since I saw him in the office several months ago he has not filled his prescriptions nor has he changed his diet were modified as risk factors with regards to smoking. He seen today with a headache over the last 8 days and elevated blood pressure.     Current Meds  Medication Sig  . losartan (COZAAR) 25 MG tablet Take 1 tablet (25 mg total) by mouth daily.     No Known Allergies  Social History   Socioeconomic History  . Marital status: Single    Spouse name: Not on file  . Number of children: Not on file  . Years of education: Not on file  . Highest education level: Not on file  Social Needs  . Financial resource strain: Not on file  . Food insecurity - worry: Not on file  . Food insecurity - inability: Not on file  . Transportation needs - medical: Not on file  . Transportation needs - non-medical: Not on file  Occupational History  . Not on file  Tobacco Use  . Smoking status: Current Every Day Smoker    Packs/day: 0.50    Types: Cigarettes  . Smokeless tobacco: Never Used  Substance and  Sexual Activity  . Alcohol use: Yes  . Drug use: No  . Sexual activity: Not on file  Other Topics Concern  . Not on file  Social History Narrative  . Not on file     Review of Systems: General: negative for chills, fever, night sweats or weight changes.  Cardiovascular: negative for chest pain, dyspnea on exertion, edema, orthopnea, palpitations, paroxysmal nocturnal dyspnea or shortness of breath Dermatological: negative for rash Respiratory: negative for cough or wheezing Urologic: negative for hematuria Abdominal: negative for nausea, vomiting, diarrhea, bright red blood per rectum, melena, or hematemesis Neurologic: negative for visual changes, syncope, or dizziness All other systems reviewed and are otherwise negative except as noted above.    Blood pressure (!) 163/104, pulse 92, height 6\' 2"  (1.88 m), weight (!) 300 lb 12.8 oz (136.4 kg), SpO2 98 %.  General appearance: alert and no distress Neck: no adenopathy, no carotid bruit, no JVD, supple, symmetrical, trachea midline and thyroid not enlarged, symmetric, no tenderness/mass/nodules Lungs: clear to auscultation bilaterally Heart: regular rate and rhythm, S1, S2 normal, no murmur, click, rub or gallop Extremities: extremities normal, atraumatic, no cyanosis or edema Pulses: 2+ and symmetric Skin: Skin color, texture, turgor normal. No rashes or lesions Neurologic: Alert and oriented X 3, normal strength and tone. Normal symmetric reflexes. Normal coordination and gait  EKG not performed today  ASSESSMENT AND PLAN:   Essential hypertension History of essential hypertension. Blood pressure measured 163/104. He had partially has not filled any prescriptions which I prescribed since his last office visit nor has he complied with salt restriction. We talked about the importance of dietary modification. I am going to begin him on losartan 50 mg a day and Bystolic  5 mg a day. He will obtain a digital blood pressure cuff and  keep a blood pressure log over the next 30 days. He'll see Belenda CruiseKristin in the office at that time to review his findings and titrate his medications.  Obstructive sleep apnea Mr. Wlodarczyk has classic symptoms of obstructive sleep apnea with daytime somnolence and snoring. I'm going to obtain a outpatient sleep study to rule out obstructive sleep apnea.      Runell GessJonathan J. Caliah Kopke MD FACP,FACC,FAHA, [facility] 07/14/2017 3:43 PM

## 2017-07-14 NOTE — Patient Instructions (Signed)
Medication Instructions: Your physician recommends that you continue on your current medications as directed. Please refer to the Current Medication list given to you today.  START Losartan 50 mg daily  START Bystolic 5 mg daily.   Labwork: Your physician recommends that you return for a FASTING lipid profile and hepatic function panel at your earliest convenience.  Your physician recommends that you return for lab work in: 2 weeks--BMET  Testing/Procedures: Your physician has recommended that you have a sleep study. This test records several body functions during sleep, including: brain activity, eye movement, oxygen and carbon dioxide blood levels, heart rate and rhythm, breathing rate and rhythm, the flow of air through your mouth and nose, snoring, body muscle movements, and chest and belly movement.  Follow-Up: Your physician recommends that you schedule a follow-up appointment in: 1 month with PharmD in [facility]. Your physician has requested that you regularly monitor and record your blood pressure readings at home. Please use the same machine at the same time of day to check your readings and record them to bring to your follow-up visit. Ambrose Finland([place])   Your physician recommends that you schedule a follow-up appointment in: 3 months with Dr. Allyson SabalBerry.

## 2017-07-18 DIAGNOSIS — Z13220 Encounter for screening for lipoid disorders: Secondary | ICD-10-CM | POA: Diagnosis not present

## 2017-07-18 LAB — HEPATIC FUNCTION PANEL
ALT: 31 IU/L (ref 0–44)
AST: 17 IU/L (ref 0–40)
Albumin: 4.4 g/dL (ref 3.5–5.5)
Alkaline Phosphatase: 53 IU/L (ref 39–117)
BILIRUBIN TOTAL: 0.4 mg/dL (ref 0.0–1.2)
BILIRUBIN, DIRECT: 0.11 mg/dL (ref 0.00–0.40)
TOTAL PROTEIN: 7.1 g/dL (ref 6.0–8.5)

## 2017-07-18 LAB — LIPID PANEL
Chol/HDL Ratio: 3.6 ratio (ref 0.0–5.0)
Cholesterol, Total: 195 mg/dL (ref 100–199)
HDL: 54 mg/dL (ref >39)
LDL Calculated: 129 mg/dL — ABNORMAL HIGH (ref 0–99)
Triglycerides: 61 mg/dL (ref 0–149)
VLDL CHOLESTEROL CAL: 12 mg/dL (ref 5–40)

## 2017-07-25 ENCOUNTER — Other Ambulatory Visit: Payer: Self-pay | Admitting: Cardiovascular Disease

## 2017-07-25 ENCOUNTER — Encounter: Payer: Self-pay | Admitting: Cardiovascular Disease

## 2017-07-25 DIAGNOSIS — E785 Hyperlipidemia, unspecified: Secondary | ICD-10-CM

## 2017-07-31 ENCOUNTER — Telehealth: Payer: Self-pay

## 2017-07-31 NOTE — Telephone Encounter (Signed)
Clinical supervisor made aware that pt needs PA from insurance for new medications started a few weeks ago so pt has not started the medication. Spoke with Dr. Hazle CocaBerry's primary CMA she started a PA for pt and hopes to know in the next 72 hours if Bystolic 5 mg was approved. [facility], to carry pt until he sees PharmD on 2/12 for BP appt.  Called pt to see how he was feeling stated his BP is still elevated and he has been keeping a log at home. His lowest pressure was 155/104 and he has been taking the Losartan. He has no complaints at this time as he states his headaches have gotten better. When asked about his diet he stated he is not adding anything to his food once he gets it. Pt also reminded he needs to get scheduled for his Sleep Study.Pt verbalized understanding, no additional questions at this time.

## 2017-08-01 NOTE — Telephone Encounter (Signed)
Received response from [facility]. Bystolic 5 mg has been denied due to no trial of oral generic beta-blockers or gheneric beta-blocker combination.Coverage will not be authorized at this time.  Case (claim) #: [id number]

## 2017-08-01 NOTE — Telephone Encounter (Signed)
Spoke to Dr. Allyson SabalBerry in case Bystolic is denied. Dr. Allyson SabalBerry stated pt can try Carvedilol 6.25 mg BID. If Bystolic is not approved, will call pt and send this in to his pharmacy.

## 2017-08-01 NOTE — Telephone Encounter (Signed)
Spoke to pt to inform him. Pt stated he received samples of Bystolic 5 mg yesterday (1 month supply) and started taking those last night. He would like to continue on this until his appt on 2/12 with PharmD and decide at that appt where to go from there. Informed pt I will send this message to Dr. Allyson SabalBerry as an Lorain ChildesFYI and to PharmD as Lorain ChildesFYI for upcoming appt.

## 2017-08-03 ENCOUNTER — Ambulatory Visit (HOSPITAL_COMMUNITY)
Admission: EM | Admit: 2017-08-03 | Discharge: 2017-08-03 | Disposition: A | Discharge status: Home / Self Care | Payer: Commercial Managed Care - PPO | Attending: Family Medicine | Admitting: Family Medicine

## 2017-08-03 ENCOUNTER — Ambulatory Visit: Payer: Commercial Managed Care - PPO | Admitting: Sports Medicine

## 2017-08-03 ENCOUNTER — Encounter (HOSPITAL_COMMUNITY): Payer: Self-pay | Admitting: Emergency Medicine

## 2017-08-03 ENCOUNTER — Ambulatory Visit (INDEPENDENT_AMBULATORY_CARE_PROVIDER_SITE_OTHER): Payer: Commercial Managed Care - PPO

## 2017-08-03 DIAGNOSIS — S161XXA Strain of muscle, fascia and tendon at neck level, initial encounter: Secondary | ICD-10-CM

## 2017-08-03 DIAGNOSIS — W19XXXA Unspecified fall, initial encounter: Secondary | ICD-10-CM

## 2017-08-03 DIAGNOSIS — W108XXA Fall (on) (from) other stairs and steps, initial encounter: Secondary | ICD-10-CM | POA: Diagnosis not present

## 2017-08-03 DIAGNOSIS — S8262XA Displaced fracture of lateral malleolus of left fibula, initial encounter for closed fracture: Secondary | ICD-10-CM | POA: Diagnosis not present

## 2017-08-03 DIAGNOSIS — S82492A Other fracture of shaft of left fibula, initial encounter for closed fracture: Secondary | ICD-10-CM | POA: Diagnosis not present

## 2017-08-03 DIAGNOSIS — S39012A Strain of muscle, fascia and tendon of lower back, initial encounter: Secondary | ICD-10-CM

## 2017-08-03 DIAGNOSIS — S82839A Other fracture of upper and lower end of unspecified fibula, initial encounter for closed fracture: Secondary | ICD-10-CM

## 2017-08-03 HISTORY — DX: Essential (primary) hypertension: I10

## 2017-08-03 MED ORDER — IBUPROFEN 800 MG PO TABS
800.0000 mg | ORAL_TABLET | Freq: Once | ORAL | Status: AC
  Administered 2017-08-03: 800 mg via ORAL

## 2017-08-03 MED ORDER — IBUPROFEN 800 MG PO TABS
ORAL_TABLET | ORAL | Status: AC
  Filled 2017-08-03: qty 1

## 2017-08-03 NOTE — ED Triage Notes (Signed)
PT reports he fell down a flight of stairs Friday night. PT reports left ankle swelling since fall. PT is able to bear weight. He also reports neck and back pain.

## 2017-08-03 NOTE — ED Provider Notes (Signed)
[facility]    CSN: [id number] Arrival date & time: 08/03/17  1258     History   Chief Complaint Chief Complaint  Patient presents with  . Fall    HPI Nathan Garcia is a 42 y.o. male.   [name] presents with complaints of left ankle swelling, neck and back stiffness after he fell down his stairs on 1/25. States he was intoxicated at the time and carrying food and drink in his hands, lost his balance and fell down the stairs on his buttocks. States he crawled up the stairs after and ambulated to bed. Has been ambulatory since. Does not recall exactly how his ankle was injured with the fall. Denies known loss of consciousness. No laceration or abrasions, does not think he hit his head. Left ankle has been swollen since. Took ibuprofen yesterday. Minimal pain. Stiffness to neck and back, primarily in the morning when he wakes. Without upper or lower extremity numbness or tingling, weakness, urinary or stool incontinence. History of hypertension. Is not on any blood thinners.    ROS per HPI.       Past Medical History:  Diagnosis Date  . Hypertension     Patient Active Problem List   Diagnosis Date Noted  . Obstructive sleep apnea 01/06/2017  . Essential hypertension 12/22/2016  . Abscess of right axilla 12/22/2016    History reviewed. No pertinent surgical history.     Home Medications    Prior to Admission medications   Medication Sig Start Date End Date Taking? Authorizing Provider  losartan (COZAAR) 50 MG tablet Take 1 tablet (50 mg total) by mouth daily. 07/14/17  Yes Runell Gess, MD  nebivolol (BYSTOLIC) 5 MG tablet Take 1 tablet (5 mg total) by mouth daily. 07/14/17  Yes Runell Gess, MD    Family History History reviewed. No pertinent family history.  Social History Social History   Tobacco Use  . Smoking status: Current Every Day Smoker    Packs/day: 0.50    Types: Cigarettes  . Smokeless tobacco: Never Used  Substance Use  Topics  . Alcohol use: Yes  . Drug use: No     Allergies   Patient has no known allergies.   Review of Systems Review of Systems   Physical Exam Triage Vital Signs ED Triage Vitals [08/03/17 1323]  Enc Vitals Group     BP 133/81     Pulse Rate 78     Resp 16     Temp 98.6 F (37 C)     Temp Source Oral     SpO2 99 %     Weight 300 lb (136.1 kg)     Height 6\' 2"  (1.88 m)     Head Circumference      Peak Flow      Pain Score 2     Pain Loc      Pain Edu?      Excl. in GC?    No data found.  Updated Vital Signs BP 133/81   Pulse 78   Temp 98.6 F (37 C) (Oral)   Resp 16   Ht 6\' 2"  (1.88 m)   Wt 300 lb (136.1 kg)   SpO2 99%   BMI 38.52 kg/m   Visual Acuity Right Eye Distance:   Left Eye Distance:   Bilateral Distance:    Right Eye Near:   Left Eye Near:    Bilateral Near:     Physical Exam  Constitutional: He is oriented  to person, place, and time. He appears well-developed and well-nourished.  HENT:  Head: Normocephalic and atraumatic.  Right Ear: External ear normal.  Left Ear: External ear normal.  Eyes: Conjunctivae and EOM are normal. Pupils are equal, round, and reactive to light.  Neck: Normal range of motion and full passive range of motion without pain. No spinous process tenderness and no muscular tenderness present. No neck rigidity. No edema, no erythema and normal range of motion present.    Neck spinous process without tenderness or step off. Full active and passive ROM. Stretching sensation to left musculature with chin to chest; upper extremities with sensation intact, strength equal  Cardiovascular: Normal rate and regular rhythm.  Pulmonary/Chest: Effort normal and breath sounds normal.  Musculoskeletal:       Left ankle: He exhibits swelling. He exhibits normal range of motion, no ecchymosis, no deformity, no laceration and normal pulse. Tenderness. Lateral malleolus tenderness found.       Thoracic back: He exhibits normal range  of motion, no tenderness, no bony tenderness, no swelling, no pain, no spasm and normal pulse.  Unable to reproduce any back pain with palpation or motion; without spinous process tenderness or step off. Strength to bilateral lower extremities equal. Sensation intact; left ankle with generalized ankle; sensation intact, moving toes; cap refill <2 seconds; mild tenderness to lateral malleolus; without pain with full active ROM to ankle  Neurological: He is alert and oriented to person, place, and time.  Skin: Skin is warm and dry.     UC Treatments / Results  Labs (all labs ordered are listed, but only abnormal results are displayed) Labs Reviewed - No data to display  EKG  EKG Interpretation None       Radiology Dg Ankle Complete Left  Result Date: 08/03/2017 CLINICAL DATA:  Status post fall, swelling EXAM: LEFT ANKLE COMPLETE - 3+ VIEW COMPARISON:  None. FINDINGS: There is a nondisplaced fracture of the posterior distal fibular metaphysis seen on the lateral view. There is no evidence of arthropathy or other focal bone abnormality. There is severe soft tissue swelling over the lateral malleolus. IMPRESSION: 1. Nondisplaced fracture of the posterior distal fibular metaphysis with severe overlying soft tissue swelling. Electronically Signed   By: Elige Ko   On: 08/03/2017 14:00    Procedures Procedures (including critical care time)  Medications Ordered in UC Medications  ibuprofen (ADVIL,MOTRIN) tablet 800 mg (800 mg Oral Given 08/03/17 1350)     Initial Impression / Assessment and Plan / UC Course  I have reviewed the triage vital signs and the nursing notes.  Pertinent labs & imaging results that were available during my care of the patient were reviewed by me and considered in my medical decision making (see chart for details).     Without red flag findings or reproducible with palpation neck or back pain. Subtle fracture of left fibula. Boot placed, weight bearing as  tolerated. Ice, elevate, ibuprofen. Follow with orthopedics. If symptoms worsen or do not improve in the next week to return to be seen or to follow up with PCP.  Patient verbalized understanding and agreeable to plan.    Image and plan of care reviewed with supervising physician dr. Milus Glazier.   Final Clinical Impressions(s) / UC Diagnoses   Final diagnoses:  Fall, initial encounter  Strain of neck muscle, initial encounter  Back strain, initial encounter  Closed fracture of distal end of fibula, unspecified fracture morphology, initial encounter    ED Discharge Orders  None       Controlled Substance Prescriptions [place] Controlled Substance Registry consulted? Not Applicable   Georgetta HaberBurky, Natalie B, NP 08/03/17 1414    Linus MakoBurky, Natalie B, NP 08/03/17 1418

## 2017-08-03 NOTE — Discharge Instructions (Signed)
You do have a nondisplaced fracture of your tibia. We will place you in a boot, weighty bearing as tolerated.  Please call and make follow up appointment with orthopedics. Ice and elevate. Ibuprofen three times a day for pain and swelling control.

## 2017-08-10 DIAGNOSIS — Z13220 Encounter for screening for lipoid disorders: Secondary | ICD-10-CM | POA: Diagnosis not present

## 2017-08-10 DIAGNOSIS — I10 Essential (primary) hypertension: Secondary | ICD-10-CM | POA: Diagnosis not present

## 2017-08-10 DIAGNOSIS — E785 Hyperlipidemia, unspecified: Secondary | ICD-10-CM | POA: Diagnosis not present

## 2017-08-10 LAB — HEPATIC FUNCTION PANEL
ALT: 38 IU/L (ref 0–44)
AST: 17 IU/L (ref 0–40)
Albumin: 4.6 g/dL (ref 3.5–5.5)
Alkaline Phosphatase: 50 IU/L (ref 39–117)
BILIRUBIN, DIRECT: 0.19 mg/dL (ref 0.00–0.40)
Bilirubin Total: 0.8 mg/dL (ref 0.0–1.2)
TOTAL PROTEIN: 7.1 g/dL (ref 6.0–8.5)

## 2017-08-10 LAB — BASIC METABOLIC PANEL
BUN/Creatinine Ratio: 16 (ref 9–20)
BUN: 14 mg/dL (ref 6–24)
CO2: 21 mmol/L (ref 20–29)
CREATININE: 0.87 mg/dL (ref 0.76–1.27)
Calcium: 9.5 mg/dL (ref 8.7–10.2)
Chloride: 104 mmol/L (ref 96–106)
GFR calc Af Amer: 124 mL/min/{1.73_m2} (ref >59)
GFR, EST NON AFRICAN AMERICAN: 107 mL/min/{1.73_m2} (ref >59)
Glucose: 95 mg/dL (ref 65–99)
POTASSIUM: 4.3 mmol/L (ref 3.5–5.2)
SODIUM: 140 mmol/L (ref 134–144)

## 2017-08-10 LAB — LIPID PANEL
Chol/HDL Ratio: 3.8 ratio (ref 0.0–5.0)
Cholesterol, Total: 195 mg/dL (ref 100–199)
HDL: 51 mg/dL (ref >39)
LDL CALC: 124 mg/dL — AB (ref 0–99)
Triglycerides: 101 mg/dL (ref 0–149)
VLDL Cholesterol Cal: 20 mg/dL (ref 5–40)

## 2017-08-14 ENCOUNTER — Ambulatory Visit (INDEPENDENT_AMBULATORY_CARE_PROVIDER_SITE_OTHER): Payer: Commercial Managed Care - PPO

## 2017-08-14 ENCOUNTER — Encounter: Payer: Self-pay | Admitting: Family Medicine

## 2017-08-14 ENCOUNTER — Ambulatory Visit: Payer: Commercial Managed Care - PPO | Admitting: Family Medicine

## 2017-08-14 VITALS — BP 154/97 | HR 98 | Ht 74.0 in | Wt 308.0 lb

## 2017-08-14 DIAGNOSIS — Z23 Encounter for immunization: Secondary | ICD-10-CM

## 2017-08-14 DIAGNOSIS — W19XXXD Unspecified fall, subsequent encounter: Secondary | ICD-10-CM | POA: Diagnosis not present

## 2017-08-14 DIAGNOSIS — S82892A Other fracture of left lower leg, initial encounter for closed fracture: Secondary | ICD-10-CM

## 2017-08-14 DIAGNOSIS — S82892D Other fracture of left lower leg, subsequent encounter for closed fracture with routine healing: Secondary | ICD-10-CM

## 2017-08-14 DIAGNOSIS — S8292XD Unspecified fracture of left lower leg, subsequent encounter for closed fracture with routine healing: Secondary | ICD-10-CM | POA: Diagnosis not present

## 2017-08-14 NOTE — Progress Notes (Signed)
Subjective:    I'm seeing this patient as a consultation for:  Luella Cook NP, CC Sunnie Nielsen, DO   CC: Left Ankle Fracture  HPI: Jeury fell down stairs at home on 07/28/17 injuring his left ankle. He was seen in the Urgent Care on 08/03/17 where he was diagnosed with a posterior lateral malleolus. The fracture was nondisplaced. He was treated with a cam walker boot with weightbearing as tolerated. He feels pretty well and denies significant pain. He has been using his cam walker boot and taking ibuprofen for pain. 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  Additionally he has been seen for hypertension by cardiology recently and was started on losartan and Bystolic. He tolerates the medications well and denies chest pains palpitations. He notes his blood pressure is typically in the 150s at home He has a follow-up appointment tomorrow.  Additionally he is interested in Tdap vaccine today if possible.  Past medical history, Surgical history, Family history not pertinant except as noted below, Social history, Allergies, and medications have been entered into the medical record, reviewed, and no changes needed.   Review of Systems: No headache, visual changes, nausea, vomiting, diarrhea, constipation, dizziness, abdominal pain, skin rash, fevers, chills, night sweats, weight loss, swollen lymph nodes, body aches, joint swelling, muscle aches, chest pain, shortness of breath, mood changes, visual or auditory hallucinations.   Objective:    Vitals:   08/14/17 0817  BP: (!) 154/97  Pulse: 98   General: Well Developed, well nourished, and in no acute distress.  Neuro/Psych: Alert and oriented x3, extra-ocular muscles intact, able to move all 4 extremities, sensation grossly intact. Skin: Warm and dry, no rashes noted.  Respiratory: Not using accessory muscles, speaking in full sentences, trachea midline.  Cardiovascular: Pulses palpable, no extremity edema. Abdomen: Does not appear  distended. MSK: Left ankle swollen and mild TTP he lateral malleolus. Normal motion. Ligamentous stability not tested. Pulses capillary refill and sensation are intact.  X-ray dated 08/03/2017 independently reviewed. Agree with posterior aspect of the lateral malleolus fracture with no significant displacement.  Left ankle x-ray today: independent review by me. I agree with the below findings.   No results found for this or any previous visit (from the past 24 hour(s)). Dg Ankle Complete Left  Result Date: 08/14/2017 CLINICAL DATA:  Follow-up of left ankle fracture. EXAM: LEFT ANKLE COMPLETE - 3+ VIEW COMPARISON:  Left ankle series of August 03, 2017 FINDINGS: The previously demonstrated nondisplaced fracture of the posterior aspect of the distal fibular metaphysis is no longer evident. There remains mild generalized soft tissue swelling. The distal tibia is intact. The joint mortise is preserved. The talus and calcaneus exhibit no acute or healing fracture. IMPRESSION: The previously demonstrated posterior cortex fracture of the distal fibula is no longer evident. There is no acute bony abnormality. There is moderate diffuse soft tissue swelling of the ankle. Electronically Signed   By: David  [place] M.D.   On: 08/14/2017 08:59    Impression and Recommendations:    Assessment and Plan: 42 y.o. male with left ankle fracture clinically doing well. Plan to continue cam walker boot and recheck in 2 weeks..  I agree that his blood pressures not ideally manage. He has a follow-up appointment tomorrow. Refer back to PCP in the near future.  Tdap vaccine given today prior to discharge.   Orders Placed This Encounter  Procedures  . DG Ankle Complete Left    Standing Status:   Future  Number of Occurrences:   1    Standing Expiration Date:   10/13/2018    Order Specific Question:   Reason for Exam (SYMPTOM  OR DIAGNOSIS REQUIRED)    Answer:   follow up fracture    Order Specific Question:    [facility]?    [facility]    Order Specific Question:   Radiology Contrast Protocol - do NOT remove file path    Answer:   [email or web address]  . Tdap vaccine greater than or equal to 7yo IM   No orders of the defined types were placed in this encounter.   Discussed warning signs or symptoms. Please see discharge instructions. Patient expresses understanding.

## 2017-08-14 NOTE — Patient Instructions (Addendum)
Thank you for coming in today. Get xray today.  Follow up with Dr Hazle CocaBerry's team for blood pressure.  Continue the brace.  Recheck with me in 2 weeks.  Return sooner if needed.    Ankle Fracture A fracture is a break in a bone. The ankle joint is made up of three bones. These include the lower (distal)sections of your lower leg bones, called the tibia and fibula, along with a bone in your foot, called the talus. Depending on how bad the break is and if more than one ankle joint bone is broken, a cast or splint is used to protect and keep your injured bone from moving while it heals. Sometimes, surgery is required to help the fracture heal properly. There are two general types of fractures:  Stable fracture. This includes a single fracture line through one bone, with no injury to ankle ligaments. A fracture of the talus that does not have any displacement (movement of the bone on either side of the fracture line) is also stable.  Unstable fracture. This includes more than one fracture line through one or more bones in the ankle joint. It also includes fractures that have displacement of the bone on either side of the fracture line.  What are the causes?  A direct blow to the ankle.  Quickly and severely twisting your ankle.  Trauma, such as a car accident or falling from a significant height. What increases the risk? You may be at a higher risk of ankle fracture if:  You have certain medical conditions.  You are involved in high-impact sports.  You are involved in a high-impact car accident.  What are the signs or symptoms?  Tender and swollen ankle.  Bruising around the injured ankle.  Pain on movement of the ankle.  Difficulty walking or putting weight on the ankle.  A cold foot below the site of the ankle injury. This can occur if the blood vessels passing through your injured ankle were also damaged.  Numbness in the foot below the site of the ankle injury. How is this  diagnosed? An ankle fracture is usually diagnosed with a physical exam and X-rays. A CT scan may also be required for complex fractures. How is this treated? Stable fractures are treated with a cast or splint and using crutches to avoid putting weight on your injured ankle. This is followed by an ankle strengthening program. Some patients require a special type of cast, depending on other medical problems they may have. Unstable fractures require surgery to ensure the bones heal properly. Your health care provider will tell you what type of fracture you have and the best treatment for your condition. Follow these instructions at home:  Review correct crutch use with your health care provider and use your crutches as directed. Safe use of crutches is extremely important. Misuse of crutches can cause you to fall or cause injury to nerves in your hands or armpits.  Do not put weight or pressure on the injured ankle until directed by your health care provider.  To lessen the swelling, keep the injured leg elevated while sitting or lying down.  Apply ice to the injured area: ? Put ice in a plastic bag. ? Place a towel between your cast and the bag. ? Leave the ice on for 20 minutes, 2-3 times a day.  If you have a plaster or fiberglass cast: ? Do not try to scratch the skin under the cast with any objects. This can increase  your risk of skin infection. ? Check the skin around the cast every day. You may put lotion on any red or sore areas. ? Keep your cast dry and clean.  If you have a plaster splint: ? Wear the splint as directed. ? You may loosen the elastic around the splint if your toes become numb, tingle, or turn cold or blue.  Do not put pressure on any part of your cast or splint; it may break. Rest your cast only on a pillow the first 24 hours until it is fully hardened.  Your cast or splint can be protected during bathing with a plastic bag sealed to your skin with medical tape. Do  not lower the cast or splint into water.  Take medicines as directed by your health care provider. Only take over-the-counter or prescription medicines for pain, discomfort, or fever as directed by your health care provider.  Do not drive a vehicle until your health care provider specifically tells you it is safe to do so.  If your health care provider has given you a follow-up appointment, it is very important to keep that appointment. Not keeping the appointment could result in a chronic or permanent injury, pain, and disability. If you have any problem keeping the appointment, call the facility for assistance. Contact a health care provider if: You develop increased swelling or discomfort. Get help right away if:  Your cast gets damaged or breaks.  You have continued severe pain.  You develop new pain or swelling after the cast was put on.  Your skin or toenails below the injury turn blue or gray.  Your skin or toenails below the injury feel cold, numb, or have loss of sensitivity to touch.  There is a bad smell or pus draining from under the cast. This information is not intended to replace advice given to you by your health care provider. Make sure you discuss any questions you have with your health care provider. Document Released: 06/17/2000 Document Revised: 12/02/2015 Document Reviewed: 01/17/2013 Elsevier Interactive Patient Education  2017 [employer].

## 2017-08-15 ENCOUNTER — Encounter: Payer: Self-pay | Admitting: Pharmacist Clinician (PhC)/ Clinical Pharmacy Specialist

## 2017-08-15 ENCOUNTER — Ambulatory Visit: Payer: Commercial Managed Care - PPO | Admitting: Pharmacist Clinician (PhC)/ Clinical Pharmacy Specialist

## 2017-08-15 DIAGNOSIS — I10 Essential (primary) hypertension: Secondary | ICD-10-CM

## 2017-08-15 MED ORDER — CHLORTHALIDONE 25 MG PO TABS: 25.0000 mg | ORAL_TABLET | Freq: Every day | ORAL | 5 refills | Status: DC

## 2017-08-15 NOTE — Progress Notes (Signed)
08/15/2017 Nathan Garcia [birth year] [id number]   HPI:  Nathan Garcia is a 42 y.o. male patient of Dr Nathan Garcia, with a PMH below who presents today for hypertension clinic evaluation.  In addition to hypertension, Mr. Nathan RangerReynolds' medical history is significant for OSA.   Other than the Bystolic and losartan, he takes no prescription medications.   He does state that the Bystolic is not covered by his insurance and is not sure what to do when he runs out of samples.  On January 31 he did suffer a fall while going upstairs in his house, resulting in a fractured fibula.   He is currently wearing a boot on that leg, but states it causes very little pain and is currently not taking any type of pain medication.     150/102  76 152/96 148/100   Blood Pressure Goal:  130/80  Current Medications:  Losartan 50 mg qd am  Bystolic 5 mg qd am  Family Hx:  Mother belied to have hypertension for many years, he currently has no contact  Father just now starting with hypertension- in his late 3860s  No other known cardiac history  Social Hx:  Smoker, trying to reduce (now 6-8 cigarettes per day); drinks 3-5 drinks per day (vodka) 5+ days per week; 1-2cups of cofffe per day  Diet:  [employer] for dinner several nights each week, lunch served at work [employer]); has stopped adding salt to foods; cut back on fried foods; admits to eating a good supply of veggies/salads  Exercise:  None;   Home BP readings:  171/113 this am, seeing 150-190/90-115 on his home cuff.  Did not bring with him today.  Intolerances:   NKDA  Estimated Creatinine Clearance: 166.3 mL/min (by C-G formula based on SCr of 0.87 mg/dL).  Wt Readings from Last 3 Encounters:  08/14/17 (!) 308 lb (139.7 kg)  08/03/17 300 lb (136.1 kg)  07/14/17 (!) 300 lb 12.8 oz (136.4 kg)   BP Readings from Last 3 Encounters:  08/15/17 (!) 152/96  08/14/17 (!) 154/97  08/03/17 133/81   Pulse Readings from Last 3 Encounters:    08/15/17 76  08/14/17 98  08/03/17 78    Current Outpatient Medications  Medication Sig Dispense Refill  . losartan (COZAAR) 50 MG tablet Take 1 tablet (50 mg total) by mouth daily. 30 tablet 6  . nebivolol (BYSTOLIC) 5 MG tablet Take 1 tablet (5 mg total) by mouth daily. 30 tablet 6   No current facility-administered medications for this visit.     No Known Allergies  Past Medical History:  Diagnosis Date  . Hypertension     Blood pressure (!) 152/96, pulse 76.  Essential hypertension Patient with poorly controlled hypertension on losartan 50 mg and Bystolic 5 mg daily.  Because the Bystolic is not covered on his insurance, will have him start with chlorthalidone 25 mg daily in addition to the losartan.  He is to keep a daily BP log and return to the office in one month.  He was praised on his attempts to stop smoking and Chantix was offered.   He would like to continue with decreasing his daily cigarette intake for now.  I also encouraged him to cut back on his alcohol consumption in the same manner.  No more than 3 drinks daily for a period of time, then to no more than 2 per day.     Phillips HayKristin Skylar Priest PharmD CPP [facility] [address]  [address] [place], [place] [address] [phone]

## 2017-08-15 NOTE — Patient Instructions (Signed)
Return for a a follow up appointment in 1 month  Your blood pressure today is 152/96  (goal is < 130/80)  Check your blood pressure at home once to twice daily and keep record of the readings.  Take your BP meds as follows:  Finish Bystolic then start chlorthalidone 25 mg once daily in the mornings  Continue with losartan 50 mg each morning  Bring all of your meds, your BP cuff and your record of home blood pressures to your next appointment.  Exercise as you're able, try to walk approximately 30 minutes per day.  Keep salt intake to a minimum, especially watch canned and prepared boxed foods.  Eat more fresh fruits and vegetables and fewer canned items.  Avoid eating in fast food restaurants.    HOW TO TAKE YOUR BLOOD PRESSURE: . Rest 5 minutes before taking your blood pressure. .  Don't smoke or drink caffeinated beverages for at least 30 minutes before. . Take your blood pressure before (not after) you eat. . Sit comfortably with your back supported and both feet on the floor (don't cross your legs). . Elevate your arm to heart level on a table or a desk. . Use the proper sized cuff. It should fit smoothly and snugly around your bare upper arm. There should be enough room to slip a fingertip under the cuff. The bottom edge of the cuff should be 1 inch above the crease of the elbow. . Ideally, take 3 measurements at one sitting and record the average.

## 2017-08-15 NOTE — Assessment & Plan Note (Signed)
Patient with poorly controlled hypertension on losartan 50 mg and Bystolic 5 mg daily.  Because the Bystolic is not covered on his insurance, will have him start with chlorthalidone 25 mg daily in addition to the losartan.  He is to keep a daily BP log and return to the office in one month.  He was praised on his attempts to stop smoking and Chantix was offered.   He would like to continue with decreasing his daily cigarette intake for now.  I also encouraged him to cut back on his alcohol consumption in the same manner.  No more than 3 drinks daily for a period of time, then to no more than 2 per day.

## 2017-08-20 ENCOUNTER — Ambulatory Visit (HOSPITAL_BASED_OUTPATIENT_CLINIC_OR_DEPARTMENT_OTHER): Payer: Commercial Managed Care - PPO | Attending: Cardiovascular Disease

## 2017-08-28 ENCOUNTER — Ambulatory Visit: Payer: Commercial Managed Care - PPO | Admitting: Family Medicine

## 2017-08-28 ENCOUNTER — Encounter: Payer: Self-pay | Admitting: Family Medicine

## 2017-08-28 ENCOUNTER — Ambulatory Visit (INDEPENDENT_AMBULATORY_CARE_PROVIDER_SITE_OTHER): Payer: Commercial Managed Care - PPO

## 2017-08-28 VITALS — BP 159/103 | HR 97 | Ht 74.0 in | Wt 306.0 lb

## 2017-08-28 DIAGNOSIS — S82892A Other fracture of left lower leg, initial encounter for closed fracture: Secondary | ICD-10-CM | POA: Diagnosis not present

## 2017-08-28 DIAGNOSIS — I10 Essential (primary) hypertension: Secondary | ICD-10-CM | POA: Diagnosis not present

## 2017-08-28 DIAGNOSIS — S82892D Other fracture of left lower leg, subsequent encounter for closed fracture with routine healing: Secondary | ICD-10-CM

## 2017-08-28 DIAGNOSIS — S82832A Other fracture of upper and lower end of left fibula, initial encounter for closed fracture: Secondary | ICD-10-CM | POA: Diagnosis not present

## 2017-08-28 DIAGNOSIS — W19XXXD Unspecified fall, subsequent encounter: Secondary | ICD-10-CM

## 2017-08-28 NOTE — Patient Instructions (Addendum)
Thank you for coming in today. We will switch to a regular ankle brace with a shoe.  If this is not ok go back to CAM walker and let me know.  Work on foot range of motion at home.  Trace the alphabet with your big toe.   Recheck with me in 2 weeks.   Return sooner if needed.     Ankle Sprain, Phase I Rehab Ask your health care provider which exercises are safe for you. Do exercises exactly as told by your health care provider and adjust them as directed. It is normal to feel mild stretching, pulling, tightness, or discomfort as you do these exercises, but you should stop right away if you feel sudden pain or your pain gets worse.Do not begin these exercises until told by your health care provider. Stretching and range of motion exercises These exercises warm up your muscles and joints and improve the movement and flexibility of your lower leg and ankle. These exercises also help to relieve pain and stiffness. Exercise A: Gastroc and soleus stretch  1. Sit on the floor with your left / right leg extended. 2. Loop a belt or towel around the ball of your left / right foot. The ball of your foot is on the walking surface, right under your toes. 3. Keep your left / right ankle and foot relaxed and keep your knee straight while you use the belt or towel to pull your foot toward you. You should feel a gentle stretch behind your calf or knee. 4. Hold this position for __________ seconds, then release to the starting position. Repeat the exercise with your knee bent. You can put a pillow or a rolled bath towel under your knee to support it. You should feel a stretch deep in your calf or at your Achilles tendon. Repeat each stretch __________ times. Complete these stretches __________ times a day. Exercise B: Ankle alphabet  1. Sit with your left / right leg supported at the lower leg. ? Do not rest your foot on anything. ? Make sure your foot has room to move freely. 2. Think of your left /  right foot as a paintbrush, and move your foot to trace each letter of the alphabet in the air. Keep your hip and knee still while you trace. Make the letters as large as you can without feeling discomfort. 3. Trace every letter from A to Z. Repeat __________ times. Complete this exercise __________ times a day. Strengthening exercises These exercises build strength and endurance in your ankle and lower leg. Endurance is the ability to use your muscles for a long time, even after they get tired. Exercise C: Dorsiflexors  1. Secure a rubber exercise band or tube to an object, such as a table leg, that will stay still when the band is pulled. Secure the other end around your left / right foot. 2. Sit on the floor facing the object, with your left / right leg extended. The band or tube should be slightly tense when your foot is relaxed. 3. Slowly bring your foot toward you, pulling the band tighter. 4. Hold this position for __________ seconds. 5. Slowly return your foot to the starting position. Repeat __________ times. Complete this exercise __________ times a day. Exercise D: Plantar flexors  1. Sit on the floor with your left / right leg extended. 2. Loop a rubber exercise tube or band around the ball of your left / right foot. The ball of your foot is  on the walking surface, right under your toes. ? Hold the ends of the band or tube in your hands. ? The band or tube should be slightly tense when your foot is relaxed. 3. Slowly point your foot and toes downward, pushing them away from you. 4. Hold this position for __________ seconds. 5. Slowly return your foot to the starting position. Repeat __________ times. Complete this exercise __________ times a day. Exercise E: Evertors 1. Sit on the floor with your legs straight out in front of you. 2. Loop a rubber exercise band or tube around the ball of your left / right foot. The ball of your foot is on the walking surface, right under your  toes. ? Hold the ends of the band in your hands, or secure the band to a stable object. ? The band or tube should be slightly tense when your foot is relaxed. 3. Slowly push your foot outward, away from your other leg. 4. Hold this position for __________ seconds. 5. Slowly return your foot to the starting position. Repeat __________ times. Complete this exercise __________ times a day. This information is not intended to replace advice given to you by your health care provider. Make sure you discuss any questions you have with your health care provider. Document Released: 01/19/2005 Document Revised: 02/25/2016 Document Reviewed: 05/04/2015 Elsevier Interactive Patient Education  2018 [employer].

## 2017-08-28 NOTE — Progress Notes (Signed)
Nathan Garcia is a 42 y.o. male who presents to [facility]: [facility] today for left ankle fracture.  Suffered an injury to his left ankle on January 25.  He was seen in urgent care on the 31st where he was diagnosed with a lateral malleolus fracture.  I saw him on February 11.  That time he had been immobilized with a Cam walker boot for a few weeks.  He continue the immobilization and returns today saying that his pain is almost completely gone.  He feels fine with no issues.  He denies significant pain even when he does not wear the cam walker boot at home occasionally.  Hypertension: Additionally Lyda Jester was found to have hypertension at the last visit.  This is currently being managed and he was prescribed chlorthalidone.  He is not yet started taking it.  No chest pains or palpitations.  Past Medical History:  Diagnosis Date  . Hypertension    No past surgical history on file. Social History   Tobacco Use  . Smoking status: Current Every Day Smoker    Packs/day: 0.50    Types: Cigarettes  . Smokeless tobacco: Never Used  Substance Use Topics  . Alcohol use: Yes   family history is not on file.  ROS as above:  Medications: Current Outpatient Medications  Medication Sig Dispense Refill  . chlorthalidone (HYGROTON) 25 MG tablet Take 1 tablet (25 mg total) by mouth daily. 30 tablet 5  . losartan (COZAAR) 50 MG tablet Take 1 tablet (50 mg total) by mouth daily. 30 tablet 6  . nebivolol (BYSTOLIC) 5 MG tablet Take 1 tablet (5 mg total) by mouth daily. 30 tablet 6   No current facility-administered medications for this visit.    No Known Allergies  Health Maintenance Health Maintenance  Topic Date Due  . HIV Screening  08/01/1991  . INFLUENZA VACCINE  04/16/2018 (Originally 02/01/2017)  . TETANUS/TDAP  08/15/2027     Exam:  BP (!) 159/103   Pulse 97   Ht 6\' 2"   (1.88 m)   Wt (!) 306 lb (138.8 kg)   BMI 39.29 kg/m   Gen: Well NAD Left ankle normal-appearing nontender normal motion.  Pulses capillary refill and sensation are intact.  I personally (independently) visualized and performed the interpretation of the images attached in this note.  No obvious fracture visible.  No results found for this or any previous visit (from the past 72 hour(s)). Dg Ankle Complete Left  Result Date: 08/28/2017 CLINICAL DATA:  Limb recent fracture distal fibula EXAM: LEFT ANKLE COMPLETE - 3+ VIEW COMPARISON:  August 03, 2017; August 14, 2017 FINDINGS: Frontal, oblique, and lateral views obtained. There is mild soft tissue swelling. Previous apparent fracture of the distal fibula posteriorly is not appreciable on this current examination. No demonstrable fracture is evident on the current examination. No joint effusion. The joint spaces appear unremarkable. No erosive change. IMPRESSION: Currently no fracture evident. There is soft tissue swelling. Ankle mortise appears intact. No evident arthropathic change. Electronically Signed   By: Bretta Bang III M.D.   On: 08/28/2017 08:29      Assessment and Plan: 42 y.o. male with left ankle fracture doing well not very visible on today's x-ray exam.  He is asymptomatic.  Plan to switch to ASO brace and home exercise program.  Recheck in 2 weeks or so.  Offered formal physical therapy.  Patient is thinking about it.  Hypertension:  Blood pressure still elevated.  Recommend starting chlorthalidone.  Follow-up with PCP.   Orders Placed This Encounter  Procedures  . DG Ankle Complete Left    Standing Status:   Future    Number of Occurrences:   1    Standing Expiration Date:   10/27/2018    Order Specific Question:   Reason for Exam (SYMPTOM  OR DIAGNOSIS REQUIRED)    Answer:   ANKLE FRACTURE    Order Specific Question:   Preferred imaging location?    [facility]    Order Specific Question:    Radiology Contrast Protocol - do NOT remove file path    Answer:   [email or web address]   No orders of the defined types were placed in this encounter.    Discussed warning signs or symptoms. Please see discharge instructions. Patient expresses understanding.

## 2017-09-11 ENCOUNTER — Ambulatory Visit: Payer: Commercial Managed Care - PPO | Admitting: Family Medicine

## 2017-09-11 ENCOUNTER — Encounter: Payer: Self-pay | Admitting: Family Medicine

## 2017-09-11 VITALS — BP 158/103 | HR 84 | Ht 74.0 in | Wt 305.0 lb

## 2017-09-11 DIAGNOSIS — I10 Essential (primary) hypertension: Secondary | ICD-10-CM

## 2017-09-11 DIAGNOSIS — S82892A Other fracture of left lower leg, initial encounter for closed fracture: Secondary | ICD-10-CM | POA: Diagnosis not present

## 2017-09-11 NOTE — Patient Instructions (Signed)
Thank you for coming in today. Continue the support.  Recheck with me as needed.  If not mostly better in 4-6 weeks let me know.  Next step would be probably be formal PT.  Wean out of the support when able.     Ankle Sprain, Phase II Rehab Ask your health care provider which exercises are safe for you. Do exercises exactly as told by your health care provider and adjust them as directed. It is normal to feel mild stretching, pulling, tightness, or discomfort as you do these exercises, but you should stop right away if you feel sudden pain or your pain gets worse.Do not begin these exercises until told by your health care provider. Stretching and range of motion exercises These exercises warm up your muscles and joints and improve the movement and flexibility of your lower leg and ankle. These exercises also help to relieve pain and stiffness. Exercise A: Gastroc stretch, standing  1. Stand with your hands against a wall. 2. Extend your left / right leg behind you, and bend your front knee slightly. Your heels should be on the floor. 3. Keeping your heels on the floor and your back knee straight, shift your weight toward the wall. You should feel a gentle stretch in the back of your lower leg (calf). 4. Hold this position for __________ seconds. Repeat __________ times. Complete this exercise __________ times a day. Exercise B: Soleus stretch, standing 1. Stand with your hands against a wall. 2. Extend your left / right leg behind you, and bend your front knee slightly. Both of your heels should be on the floor. 3. Keeping your heels on the floor, bend your back knee and shift your weight slightly over your back leg. You should feel a gentle stretch deep in your calf. 4. Hold this position for __________ seconds. Repeat __________ times. Complete this exercise __________ times a day. Strengthening exercises These exercises build strength and endurance in your lower leg. Endurance is the  ability to use your muscles for a long time, even after they get tired. Exercise C: Heel walking ( dorsiflexion) Walk on your heels for __________ seconds or ___________ ft. Keep your toes as high as possible. Repeat __________ times. Complete this exercise __________ times a day. Balance exercises These exercises improve your balance and the reaction and control of your ankle to help improve stability. Exercise D: Multi-angle lunge 1. Stand with your feet together. 2. Take a step forward with your left / right leg, and shift your weight onto that leg. Your back heel will come off the floor, and your back toes will stay in place. 3. Push off your front leg to return your front foot to the starting position next to your other foot. 4. Repeat to the side, to the back, and any other directions as told by your health care provider. Repeat in each direction __________ times. Complete this exercise __________ times a day. Exercise E: Single leg stand 1. Without shoes, stand near a railing or in a door frame. Hold onto the railing or door frame as needed. 2. Stand on your left / right foot. Keep your big toe down on the floor and try to keep your arch lifted. 3. Hold this position for __________ seconds. Repeat __________ times. Complete this exercise __________ times a day. If this exercise is too easy, you can try it with your eyes closed or while standing on a pillow. Exercise F: Inversion/eversion  You will need a balance board for  this exercise. Ask your health care provider where you can get a balance board or how you can make one. 1. Stand on a non-carpeted surface near a countertop or wall. 2. Step onto the balance board so your feet are hip-width apart. 3. Keep your feet in place and keep your upper body and hips steady. Using only your feet and ankles to move the board, do one or both of the following exercises as told by your health care provider: ? Tip the board side to side as far as  you can, alternating between tipping to the left and tipping to the right. If you can, tip the board so it silently taps the floor. Do not let the board forcefully hit the floor. From time to time, pause to hold a steady position. ? Tip the board side to side so the board does not hit the floor at all. From time to time, pause to hold a steady position. Repeat the movement for each exercise __________ times. Complete each exercise __________ times a day. Exercise G: Plantar flexion/dorsiflexion  You will need a balance board for this exercise. Ask your health care provider where you can get a balance board or how you can make one. 1. Stand on a non-carpeted surface near a countertop or wall. 2. Step onto the balance board so your feet are hip-width apart. 3. Keep your feet in place and keep your upper body and hips steady. Using only your feet and ankles to move the board, do one or both of the following exercises as told by your health care provider: ? Tip the board forward and backward so the board silently taps the floor. Do not let the board forcefully hit the floor. From time to time, pause to hold a steady position. ? Tip the board forward and backward so the board does not hit the floor at all. From time to time, pause to hold a steady position. Repeat the movement for each exercise __________ times. Complete each exercise __________ times a day. This information is not intended to replace advice given to you by your health care provider. Make sure you discuss any questions you have with your health care provider. Document Released: 10/10/2005 Document Revised: 02/25/2016 Document Reviewed: 05/04/2015 Elsevier Interactive Patient Education  2018 [employer].

## 2017-09-11 NOTE — Progress Notes (Signed)
Nathan Garcia is a 42 y.o. male who [facility] today for follow up ankle fracture.  Mr. Nathan Garcia was seen several times in February for left lateral ankle fracture.  The x-ray showed a hairline fracture on one view.  He was treated conservatively with Cam Walker boot and subsequently on 25 February as he was doing very well weaned into an ASO brace.  Since then he notes his pain is well controlled with the ASO brace and he feels great.  He has been doing home exercise program and notes only mild soreness.   Past Medical History:  Diagnosis Date  . Hypertension    No past surgical history on file. Social History   Tobacco Use  . Smoking status: Current Every Day Smoker    Packs/day: 0.50    Types: Cigarettes  . Smokeless tobacco: Never Used  Substance Use Topics  . Alcohol use: Yes   family history is not on file.  ROS as above:  Medications: Current Outpatient Medications  Medication Sig Dispense Refill  . chlorthalidone (HYGROTON) 25 MG tablet Take 1 tablet (25 mg total) by mouth daily. 30 tablet 5  . losartan (COZAAR) 50 MG tablet Take 1 tablet (50 mg total) by mouth daily. 30 tablet 6  . nebivolol (BYSTOLIC) 5 MG tablet Take 1 tablet (5 mg total) by mouth daily. 30 tablet 6   No current facility-administered medications for this visit.    No Known Allergies  Health Maintenance Health Maintenance  Topic Date Due  . HIV Screening  08/01/1991  . INFLUENZA VACCINE  04/16/2018 (Originally 02/01/2017)  . TETANUS/TDAP  08/15/2027     Exam:  BP (!) 158/103   Pulse 84   Ht 6\' 2"  (1.88 m)   Wt (!) 305 lb (138.3 kg)   BMI 39.16 kg/m  Gen: Well NAD Left ankle swollen laterally.  Mildly tender to palpation at the lateral malleolus Normal ankle motion.  Stable ligamentous exam with slight laxity to talar tilt testing. Strength is  intact Pulses capillary refill and sensation are intact  CLINICAL DATA:  Limb recent fracture distal fibula  EXAM: LEFT ANKLE COMPLETE - 3+ VIEW  COMPARISON:  August 03, 2017; August 14, 2017  FINDINGS: Frontal, oblique, and lateral views obtained. There is mild soft tissue swelling. Previous apparent fracture of the distal fibula posteriorly is not appreciable on this current examination. No demonstrable fracture is evident on the current examination. No joint effusion. The joint spaces appear unremarkable. No erosive change.  IMPRESSION: Currently no fracture evident. There is soft tissue swelling. Ankle mortise appears intact. No evident arthropathic change.   Electronically Signed   By: Bretta BangWilliam  Woodruff III M.D.   On: 08/28/2017 08:29  I personally (independently) visualized and performed the interpretation of the images attached in this note.    Assessment and Plan: 42 y.o. male with minimal ankle fracture versus sprain.  Fracture was not visible on x-ray.  I wonder if there ever really was a fracture.  Regardless he is doing well with current management with ASO brace and home exercise program.  Plan to advance home exercises to phase 2 ankle sprain.  Recheck as needed.  Discussed precautions.  Follow-up with PCP regarding blood pressure which remains elevated today on several rechecks.   No orders of the defined types were placed in this encounter.  No orders of the defined types were placed in this encounter.  Discussed warning signs or symptoms. Please see discharge instructions. Patient expresses understanding.

## 2017-09-12 ENCOUNTER — Ambulatory Visit: Payer: Commercial Managed Care - PPO

## 2017-09-21 ENCOUNTER — Ambulatory Visit: Payer: Commercial Managed Care - PPO | Admitting: Pharmacist

## 2017-09-21 VITALS — BP 138/90 | HR 83

## 2017-09-21 DIAGNOSIS — I10 Essential (primary) hypertension: Secondary | ICD-10-CM

## 2017-09-21 LAB — BASIC METABOLIC PANEL
BUN/Creatinine Ratio: 16 (ref 9–20)
BUN: 14 mg/dL (ref 6–24)
CO2: 25 mmol/L (ref 20–29)
CREATININE: 0.86 mg/dL (ref 0.76–1.27)
Calcium: 9.5 mg/dL (ref 8.7–10.2)
Chloride: 96 mmol/L (ref 96–106)
GFR calc Af Amer: 125 mL/min/{1.73_m2} (ref >59)
GFR, EST NON AFRICAN AMERICAN: 108 mL/min/{1.73_m2} (ref >59)
Glucose: 92 mg/dL (ref 65–99)
Potassium: 3.9 mmol/L (ref 3.5–5.2)
SODIUM: 136 mmol/L (ref 134–144)

## 2017-09-21 MED ORDER — AMLODIPINE BESYLATE 5 MG PO TABS: 5.0000 mg | ORAL_TABLET | Freq: Every day | ORAL | 3 refills | Status: DC

## 2017-09-21 NOTE — Patient Instructions (Signed)
Return for a a follow up appointment in 4 weeks.  Your blood pressure today is 138/90 mmHg   Check your blood pressure at home daily (if able) and keep record of the readings.  Take your BP meds as follows:  Add amlodipine 5 mg daily in the evening  Continue taking losartan 50 mg in the morning  Continue taking chlorthalidone 25 mg in the morning  Bring all of your meds, your BP cuff and your record of home blood pressures to your next appointment.  Exercise as you're able, try to walk approximately 30 minutes per day.  Keep salt intake to a minimum, especially watch canned and prepared boxed foods.  Eat more fresh fruits and vegetables and fewer canned items.  Avoid eating in fast food restaurants.    HOW TO TAKE YOUR BLOOD PRESSURE: . Rest 5 minutes before taking your blood pressure. .  Don't smoke or drink caffeinated beverages for at least 30 minutes before. . Take your blood pressure before (not after) you eat. . Sit comfortably with your back supported and both feet on the floor (don't cross your legs). . Elevate your arm to heart level on a table or a desk. . Use the proper sized cuff. It should fit smoothly and snugly around your bare upper arm. There should be enough room to slip a fingertip under the cuff. The bottom edge of the cuff should be 1 inch above the crease of the elbow. . Ideally, take 3 measurements at one sitting and record the average.

## 2017-09-21 NOTE — Progress Notes (Signed)
HPI:  Nathan Garcia is a 42 y.o. male patient of Dr Allyson SabalBerry, with a PMH significant for hypertension and OSA who presents today for blood pressure follow-up. BP was elevated during his last office visit at 152/96 mmHg and chlorthalidone 25 mg was added. Patient denies any SOB, dizziness, or swelling of the ankles/feet. Patient reported dull chest pain one day last week walking into work and has noticed some occasional headaches. He stopped taking his bystolic due to cost.   Blood Pressure Goal:  130/80 mmHg  Current Medications:  Losartan 50 mg qd am  Chlorthalidone 25 qd am   Family History:  Mother belied to have hypertension for many years, he currently has no contact  Father just now starting with hypertension- in his late 4960s  No other known cardiac history  Social History:  Smoker, trying to reduce (now 5-6 cigarettes per day)  Drinks 3-5 drinks per day (vodka) 6 days per week    1 cup of black cofffe per day, no soda  Diet:  Patient does not cook at home. [employer] for dinner several nights each week, lunch served at work [employer]); doesn't add salt to any foods; cut back on fried foods; admits to eating a good supply of veggies/salads.   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  Wt Readings from Last 3 Encounters:  09/11/17 (!) 305 lb (138.3 kg)  08/28/17 (!) 306 lb (138.8 kg)  08/14/17 (!) 308 lb (139.7 kg)   BP Readings from Last 3 Encounters:  09/21/17 138/90  09/11/17 (!) 158/103  08/28/17 (!) 159/103   Pulse Readings from Last 3 Encounters:  09/21/17 83  09/11/17 84  08/28/17 97    Current Outpatient Medications  Medication Sig Dispense Refill  . amLODipine (NORVASC) 5 MG tablet Take 1 tablet (5 mg total) by mouth daily. 30 tablet 3  . chlorthalidone (HYGROTON) 25 MG tablet Take 1 tablet (25 mg total) by mouth  daily. 30 tablet 5  . losartan (COZAAR) 50 MG tablet Take 1 tablet (50 mg total) by mouth daily. 30 tablet 6  . nebivolol (BYSTOLIC) 5 MG tablet Take 1 tablet (5 mg total) by mouth daily. 30 tablet 6   No current facility-administered medications for this visit.     No Known Allergies  Past Medical History:  Diagnosis Date  . Hypertension     Blood pressure 138/90, pulse 83.  Essential hypertension Patient is still above BP goal of 130/80 mmHg. His blood pressure today in clinic was 138/90 mmHg which has improved since his last visit. His home blood pressure cuff is accurate when compared to our readings. However, he was not placing the sensor on his cuff in the correct location on his arm; once the sensor was placed in the correct location his readings dropped ~15 mmHg. Home readings may not be as accurate due to the incorrect placement of sensor.    Patient started taking the chlorthalidone on 09/11/17 and has not had a BMET since starting. Will plan to start amlodipine 5 mg daily in the evenings and order a BMET today. Patient was educated on the importance of decreasing daily alcohol intake and smoking habits. Provided patient with a log to record home readings. BP follow-up in 4 weeks.   Note written by: Sherene SiresAlexandra Petty, PharmD Candidate.  I was  preset during office visit.. Assessment and Plan was discussed and approved by Karron Alvizo Rodriguez-Guzman PharmD, BCPS, CPP [facility] [address] [place] [address] 09/25/2017 5:30 PM

## 2017-09-21 NOTE — Assessment & Plan Note (Signed)
Patient is still above BP goal of 130/80 mmHg. His blood pressure today in clinic was 138/90 mmHg which has improved since his last visit. His home blood pressure cuff is accurate when compared to our readings. However, he was not placing the sensor on his cuff in the correct location on his arm; once the sensor was placed in the correct location his readings dropped ~15 mmHg. Home readings may not be as accurate due to the incorrect placement of sensor.    Patient started taking the chlorthalidone on 09/11/17 and has not had a BMET since starting. Will plan to start amlodipine 5 mg daily in the evenings and order a BMET today. Patient was educated on the importance of decreasing daily alcohol intake and smoking habits. Provided patient with a log to record home readings. BP follow-up in 4 weeks.

## 2017-09-25 ENCOUNTER — Encounter: Payer: Self-pay | Admitting: Pharmacist

## 2017-10-13 ENCOUNTER — Ambulatory Visit: Payer: Commercial Managed Care - PPO | Admitting: Cardiovascular Disease

## 2017-10-19 ENCOUNTER — Ambulatory Visit: Payer: Commercial Managed Care - PPO | Admitting: Pharmacist Clinician (PhC)/ Clinical Pharmacy Specialist

## 2017-10-19 DIAGNOSIS — I10 Essential (primary) hypertension: Secondary | ICD-10-CM

## 2017-10-19 MED ORDER — AMLODIPINE BESYLATE 10 MG PO TABS: 10.0000 mg | ORAL_TABLET | Freq: Every day | ORAL | 3 refills | Status: DC

## 2017-10-19 NOTE — Assessment & Plan Note (Signed)
Making steady progress with lowering of blood pressure, but still not to goal.  Will increase amlodipine to 10 mg each evening.  He was advised to watch for increased swelling in his ankles and feet, and should it become bothersome he will need to call.  He is to continue with home BP checks as well as continue to decrease cigarettes and alcohol.  He is scheduled to see Dr. Allyson SabalBerry in about 3 weeks, and we will follow up with him after that as needed

## 2017-10-19 NOTE — Patient Instructions (Addendum)
Return for a a follow up appointment with Dr. Allyson SabalBerry on May 7  Your blood pressure today is 138/94  Check your blood pressure at home several days each week and keep record of the readings.  Take your BP meds as follows:  Increase amlodipine to 10 mg once daily   Continue with all other medications  Bring all of your meds, your BP cuff and your record of home blood pressures to your next appointment.  Exercise as you're able, try to walk approximately 30 minutes per day.  Keep salt intake to a minimum, especially watch canned and prepared boxed foods.  Eat more fresh fruits and vegetables and fewer canned items.  Avoid eating in fast food restaurants.    HOW TO TAKE YOUR BLOOD PRESSURE: . Rest 5 minutes before taking your blood pressure. .  Don't smoke or drink caffeinated beverages for at least 30 minutes before. . Take your blood pressure before (not after) you eat. . Sit comfortably with your back supported and both feet on the floor (don't cross your legs). . Elevate your arm to heart level on a table or a desk. . Use the proper sized cuff. It should fit smoothly and snugly around your bare upper arm. There should be enough room to slip a fingertip under the cuff. The bottom edge of the cuff should be 1 inch above the crease of the elbow. . Ideally, take 3 measurements at one sitting and record the average.

## 2017-10-19 NOTE — Progress Notes (Signed)
HPI:  Nathan Garcia is a 42 y.o. male patient of Dr Allyson SabalBerry, with a PMH significant for hypertension, obesity and OSA who presents today for blood pressure follow-up.  Over the last several visits he has started both chlorthalidone and amlodipine, doing well with each.  Labs drawn at his last visit show BMET all WNL.  He previously took Arts development officerBystolic, but discontinued due to high cost.  Since we started seeing him he reports compliance with medications and has been working to cut back on negative lifestyle factors.  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    Today he comes in and reports that home BP readings are down some, but still not to goal.  He did not bring a list of readings (left them on the kitchen counter), but states they are averaging 140-150/90-95.  He has cut back to no more than 6 cigarettes and 2-3 vodka drinks per day.  He has no complaints of chest pain, shortness of breath or dizziness.  He has noted some lower extremity edema after some really long days at work (he has worked 21 days without a day off recently), but it is resolved by mornings.   Blood Pressure Goal:  130/80 mmHg  Current Medications:  Losartan 50 mg qd am  Chlorthalidone 25 qd am   Amlodipine 5 mg qpm  Family History:  Mother belied to have hypertension for many years, he currently has no contact  Father just now starting with hypertension- in his late 6560s  No other known cardiac history  Social History:  Smoker, trying to reduce (now averaging 4-6 per day)  Previously drank 3-5 drinks per day (vodka) 6 days per week  - now keeping it at 2-3 drinks   1 cup of black cofffe per day, no soda  Diet:  Patient does not cook at home. [employer] for dinner several nights each week, lunch served at work [employer]); doesn't add salt to any foods; cut back on fried foods; admits to eating a good supply of veggies/salads.   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  Wt Readings from Last 3 Encounters:  10/19/17 (!) 306 lb (138.8 kg)  09/11/17 (!) 305 lb (138.3 kg)  08/28/17 (!) 306 lb (138.8 kg)   BP Readings from Last 3 Encounters:  10/19/17 (!) 138/94  09/21/17 138/90  09/11/17 (!) 158/103   Pulse Readings from Last 3 Encounters:  10/19/17 80  09/21/17 83  09/11/17 84    Current Outpatient Medications  Medication Sig Dispense Refill  . amLODipine (NORVASC) 10 MG tablet Take 1 tablet (10 mg total) by mouth daily. 30 tablet 3  . chlorthalidone (HYGROTON) 25 MG tablet Take 1 tablet (25 mg total) by mouth daily. 30 tablet 5  . losartan (COZAAR) 50 MG tablet Take 1 tablet (50 mg total) by mouth daily. 30 tablet 6  . nebivolol (BYSTOLIC) 5 MG tablet Take 1 tablet (5 mg total) by mouth daily. 30 tablet 6   No current facility-administered medications for this visit.     No Known Allergies  Past Medical History:  Diagnosis Date  . Hypertension     Blood pressure (!) 138/94, pulse 80, height 6\' 2"  (1.88 m), weight (!) 306 lb (138.8 kg).  Essential hypertension Making steady  progress with lowering of blood pressure, but still not to goal.  Will increase amlodipine to 10 mg each evening.  He was advised to watch for increased swelling in his ankles and feet, and should it become bothersome he will need to call.  He is to continue with home BP checks as well as continue to decrease cigarettes and alcohol.  He is scheduled to see Dr. Allyson Sabal in about 3 weeks, and we will follow up with him after that as needed  Phillips Hay PharmD CPP [facility] [address] [place] [address] 10/19/2017 9:09 AM

## 2017-11-07 ENCOUNTER — Ambulatory Visit: Payer: Commercial Managed Care - PPO | Admitting: Cardiovascular Disease

## 2017-11-07 ENCOUNTER — Encounter: Payer: Self-pay | Admitting: Cardiovascular Disease

## 2017-11-07 VITALS — BP 138/82 | HR 101 | Ht 74.0 in | Wt 304.6 lb

## 2017-11-07 DIAGNOSIS — I10 Essential (primary) hypertension: Secondary | ICD-10-CM

## 2017-11-07 DIAGNOSIS — Z720 Tobacco use: Secondary | ICD-10-CM | POA: Diagnosis not present

## 2017-11-07 DIAGNOSIS — G4733 Obstructive sleep apnea (adult) (pediatric): Secondary | ICD-10-CM

## 2017-11-07 NOTE — Assessment & Plan Note (Signed)
History of essential hypertension her blood pressure measured today at 138/82.  He is on amlodipine, chlorthalidone and losartan.

## 2017-11-07 NOTE — Assessment & Plan Note (Signed)
Symptoms of obstructive sleep apnea although he has never gone to get a sleep study.

## 2017-11-07 NOTE — Progress Notes (Signed)
11/07/2017 Nathan Garcia   02/13/1976  [id number]  Primary Physician Sunnie Nielsen, DO Primary Cardiologist: Runell Gess MD FACP, [place], [place], [place]  HPI:  Nathan Garcia is a 42 y.o.  moderately overweight Caucasian male with no children. 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 He was referred by Wyline Copas for coronary vessel evaluation and treatment of his hypertension. I last saw him in the office 07/14/2017. His cardiac risk factors profile is notable for tobacco abuse having smoked one half pack per day for the last 25 years. He is unaware of his lipid profile. He is not diabetic. He does have newly recognized hypertension. There is no family history. He denies chest pain or shortness of breath. He never had a heart attack or stroke. He does admit to dietary indiscretion with regards to salt as well as symptoms compatible with obstructive sleep apnea.  Since I saw him several months ago he has filled his prescriptions and is taking them as directed.  He was seeing [name] in the office for antihypertensive medication titration.  Blood pressures have been under better control since he is been taking his medicines as directed.  He was taken off the Bystolic.  He continues to smoke 3 cigarettes a day.    Current Meds  Medication Sig  . amLODipine (NORVASC) 10 MG tablet Take 1 tablet (10 mg total) by mouth daily.  . chlorthalidone (HYGROTON) 25 MG tablet Take 1 tablet (25 mg total) by mouth daily.  Marland Kitchen losartan (COZAAR) 50 MG tablet Take 1 tablet (50 mg total) by mouth daily.  . [DISCONTINUED] nebivolol (BYSTOLIC) 5 MG tablet Take 1 tablet (5 mg total) by mouth daily.     No Known Allergies  Social History   Socioeconomic History  . Marital status: Single    Spouse name: Not on file  . Number of children: Not on file  . Years of education: Not on file  . Highest education level: Not on file  Occupational History  . Not on file  Social Needs  .  Financial resource strain: Not on file  . Food insecurity:    Worry: Not on file    Inability: Not on file  . Transportation needs:    Medical: Not on file    Non-medical: Not on file  Tobacco Use  . Smoking status: Current Every Day Smoker    Packs/day: 0.50    Types: Cigarettes  . Smokeless tobacco: Never Used  Substance and Sexual Activity  . Alcohol use: Yes  . Drug use: No  . Sexual activity: Not on file  Lifestyle  . Physical activity:    Days per week: Not on file    Minutes per session: Not on file  . Stress: Not on file  Relationships  . Social connections:    Talks on phone: Not on file    Gets together: Not on file    Attends religious service: Not on file    Active member of club or organization: Not on file    Attends meetings of clubs or organizations: Not on file    Relationship status: Not on file  . Intimate partner violence:    Fear of current or ex partner: Not on file    Emotionally abused: Not on file    Physically abused: Not on file    Forced sexual activity: Not on file  Other Topics Concern  . Not on file  Social History Narrative  .  Not on file     Review of Systems: General: negative for chills, fever, night sweats or weight changes.  Cardiovascular: negative for chest pain, dyspnea on exertion, edema, orthopnea, palpitations, paroxysmal nocturnal dyspnea or shortness of breath Dermatological: negative for rash Respiratory: negative for cough or wheezing Urologic: negative for hematuria Abdominal: negative for nausea, vomiting, diarrhea, bright red blood per rectum, melena, or hematemesis Neurologic: negative for visual changes, syncope, or dizziness All other systems reviewed and are otherwise negative except as noted above.    Blood pressure 138/82, pulse (!) 101, height  (1.88 m), weight (!) 304 lb 9.6 oz (138.2 kg).  General appearance: alert and no distress Neck: no adenopathy, no carotid bruit, no JVD, supple, symmetrical,  trachea midline and thyroid not enlarged, symmetric, no tenderness/mass/nodules Lungs: clear to auscultation bilaterally Heart: regular rate and rhythm, S1, S2 normal, no murmur, click, rub or gallop Extremities: extremities normal, atraumatic, no cyanosis or edema Pulses: 2+ and symmetric Skin: Skin color, texture, turgor normal. No rashes or lesions Neurologic: Alert and oriented X 3, normal strength and tone. Normal symmetric reflexes. Normal coordination and gait  EKG sinus tachycardia 101 with nonspecific ST and T wave changes.  I personally reviewed this EKG.  ASSESSMENT AND PLAN:   Essential hypertension History of essential hypertension her blood pressure measured today at 138/82.  He is on amlodipine, chlorthalidone and losartan.  Obstructive sleep apnea Symptoms of obstructive sleep apnea although he has never gone to get a sleep study.  Tobacco abuse History of ongoing tobacco abuse smoking currently 3 cigarettes a day down from 6 cigarettes a day with the desire to stop.      Runell Gess MD FACP,FACC,FAHA, [facility] 11/07/2017 3:28 PM

## 2017-11-07 NOTE — Patient Instructions (Signed)

## 2017-11-07 NOTE — Assessment & Plan Note (Signed)
History of ongoing tobacco abuse smoking currently 3 cigarettes a day down from 6 cigarettes a day with the desire to stop.

## 2017-11-13 ENCOUNTER — Telehealth: Payer: Self-pay | Admitting: Pharmacist

## 2017-11-13 NOTE — Telephone Encounter (Signed)
Patient reports excessive swelling of lowe extremities.   Instructed to:  1. decrease amlodipine to  daily  2. Take chlorthalidone  x 2 tablets today and tomorrow.  3. Call back to update clinic before end of the week

## 2017-11-16 NOTE — Telephone Encounter (Signed)
Feet and ankles still swollen. Patient noted some improvement but not significant enough.  *Will hold amlodipine for the next 3 days and continue to monitor BP daily*  If swelling improved, we will try losartan  or lisinopril *  IF no improvement note, plan to resume amlodipine at  daily

## 2017-11-20 ENCOUNTER — Telehealth: Payer: Self-pay | Admitting: Pharmacist

## 2017-11-20 DIAGNOSIS — I10 Essential (primary) hypertension: Secondary | ICD-10-CM

## 2017-11-20 MED ORDER — LOSARTAN POTASSIUM 100 MG PO TABS: 100.0000 mg | ORAL_TABLET | Freq: Every day | ORAL | 1 refills | Status: DC

## 2017-11-20 NOTE — Telephone Encounter (Signed)
LE edema resolved after holding amlodipine for 4 days. Patient remains 130s-140s systolic and 90s diastolic.    Recommendation:  1. Discontinue amlodipine  2. Increase losartan to  daily  3. Repeat BMET in 7-10 days

## 2017-12-01 DIAGNOSIS — I10 Essential (primary) hypertension: Secondary | ICD-10-CM | POA: Diagnosis not present

## 2017-12-01 LAB — BASIC METABOLIC PANEL
BUN / CREAT RATIO: 15 (ref 9–20)
BUN: 14 mg/dL (ref 6–24)
CO2: 25 mmol/L (ref 20–29)
CREATININE: 0.92 mg/dL (ref 0.76–1.27)
Calcium: 9.8 mg/dL (ref 8.7–10.2)
Chloride: 96 mmol/L (ref 96–106)
GFR calc Af Amer: 119 mL/min/{1.73_m2} (ref >59)
GFR, EST NON AFRICAN AMERICAN: 103 mL/min/{1.73_m2} (ref >59)
GLUCOSE: 111 mg/dL — AB (ref 65–99)
POTASSIUM: 3.7 mmol/L (ref 3.5–5.2)
SODIUM: 138 mmol/L (ref 134–144)

## 2017-12-19 ENCOUNTER — Other Ambulatory Visit: Payer: Self-pay | Admitting: Pharmacist

## 2017-12-19 DIAGNOSIS — I10 Essential (primary) hypertension: Secondary | ICD-10-CM

## 2017-12-19 MED ORDER — LOSARTAN POTASSIUM 100 MG PO TABS: 100.0000 mg | ORAL_TABLET | Freq: Every day | ORAL | 1 refills | Status: DC

## 2017-12-19 MED ORDER — CHLORTHALIDONE 25 MG PO TABS: 25.0000 mg | ORAL_TABLET | Freq: Every day | ORAL | 1 refills | Status: DC

## 2018-01-16 ENCOUNTER — Ambulatory Visit: Payer: Commercial Managed Care - PPO | Admitting: Pharmacist

## 2018-01-16 ENCOUNTER — Encounter: Payer: Self-pay | Admitting: Pharmacist

## 2018-01-16 VITALS — BP 124/88 | HR 88

## 2018-01-16 DIAGNOSIS — I10 Essential (primary) hypertension: Secondary | ICD-10-CM | POA: Diagnosis not present

## 2018-01-16 MED ORDER — VALSARTAN 160 MG PO TABS: 160.0000 mg | ORAL_TABLET | Freq: Every day | ORAL | 1 refills | Status: DC

## 2018-01-16 NOTE — Progress Notes (Signed)
   HPI:  Nathan Garcia is a 42 y.o. male patient of Dr Allyson SabalBerry, with a PMH significant for hypertension, obesity and OSA who presents today for blood pressure follow-up. Patient stopped taking amlodipine on 11/20/17 due to lower extremity edema.  Since we started seeing him he reports compliance with medications and has been working to cut back on negative lifestyle factors.  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  Reports complete resolution of low extremity edema and denies dizziness, increased fatigue, blurry vision, or chest pain.  Blood Pressure Goal:  130/80 mmHg  Current Medications:  Losartan 50 mg every mornig  Chlorthalidone 25 every morning  Family History:  Mother belied to have hypertension for many years, he currently has no contact  Father just now starting with hypertension- in his late 6860s  No other known cardiac history  Social History:  Smoker, trying to reduce (now averaging 4-6 per day)  Previously drank 3-5 drinks per day (vodka) 6 days per week  - now keeping it at 2-3 drinks   1 cup of black cofffe per day, no soda  Diet:  Patient does not cook at home. [employer] for dinner several nights each week, lunch served at work [employer]); doesn't add salt to any foods; cut back on fried foods; admits to eating a good supply of veggies/salads.   Exercise:  Does not exercise although he walks most of the day at his work [employer])  Home BP readings:  18 readings; average 141/98 (HR 69-99bpm)   Wt Readings from Last 3 Encounters:  11/07/17 (!) 304 lb 9.6 oz (138.2 kg)  10/19/17 (!) 306 lb (138.8 kg)  09/11/17 (!) 305 lb (138.3 kg)   BP Readings from Last 3 Encounters:  01/16/18 124/88  11/07/17 138/82  10/19/17 (!) 138/94   Pulse Readings from Last 3 Encounters:  01/16/18 88  11/07/17 (!) 101  10/19/17 80    Current Outpatient Medications  Medication Sig Dispense Refill  . chlorthalidone  (HYGROTON) 25 MG tablet Take 1 tablet (25 mg total) by mouth daily. 90 tablet 1  . valsartan (DIOVAN) 160 MG tablet Take 1 tablet (160 mg total) by mouth daily. 30 tablet 1   No current facility-administered medications for this visit.     Allergies  Allergen Reactions  . Amlodipine     Low extremity edema    Past Medical History:  Diagnosis Date  . Hypertension     Blood pressure 124/88, pulse 88.  Essential hypertension Amlodipine was discontinued due to ADR and blood pressure remains uncontrolled since. Patient currently on  losartan 50mg  plus chlorthalidone 25mg . He still working on positive lifestyle changes. Will discontinue losartan and start valsartan 160mg  daily. Patient is to continue monitoring his BP daily and bring records to follow up visit in 4 weeks.   Foday Cone Rodriguez-Guzman PharmD, BCPS, CPP [facility] [address] [place] [phone] 01/18/2018 8:38 AM

## 2018-01-16 NOTE — Patient Instructions (Addendum)
Return for a follow up appointment in 4 weeks  Check your blood pressure at home daily (if able) and keep record of the readings.  Take your BP meds as follows: *STOP taking losartan* *START valsartan 160mg  daily*  Bring all of your meds, your BP cuff and your record of home blood pressures to your next appointment.  Exercise as you're able, try to walk approximately 30 minutes per day.  Keep salt intake to a minimum, especially watch canned and prepared boxed foods.  Eat more fresh fruits and vegetables and fewer canned items.  Avoid eating in fast food restaurants.    HOW TO TAKE YOUR BLOOD PRESSURE: . Rest 5 minutes before taking your blood pressure. .  Don't smoke or drink caffeinated beverages for at least 30 minutes before. . Take your blood pressure before (not after) you eat. . Sit comfortably with your back supported and both feet on the floor (don't cross your legs). . Elevate your arm to heart level on a table or a desk. . Use the proper sized cuff. It should fit smoothly and snugly around your bare upper arm. There should be enough room to slip a fingertip under the cuff. The bottom edge of the cuff should be 1 inch above the crease of the elbow. . Ideally, take 3 measurements at one sitting and record the average.

## 2018-01-18 ENCOUNTER — Encounter: Payer: Self-pay | Admitting: Pharmacist

## 2018-01-18 NOTE — Assessment & Plan Note (Signed)
Amlodipine was discontinued due to ADR and blood pressure remains uncontrolled since. Patient currently on  losartan 50mg  plus chlorthalidone 25mg . He still working on positive lifestyle changes. Will discontinue losartan and start valsartan 160mg  daily. Patient is to continue monitoring his BP daily and bring records to follow up visit in 4 weeks.

## 2018-02-15 ENCOUNTER — Ambulatory Visit: Payer: Commercial Managed Care - PPO | Admitting: Pharmacist

## 2018-02-15 VITALS — BP 138/92 | HR 88

## 2018-02-15 DIAGNOSIS — I10 Essential (primary) hypertension: Secondary | ICD-10-CM | POA: Diagnosis not present

## 2018-02-15 MED ORDER — VALSARTAN 160 MG PO TABS: 240.0000 mg | ORAL_TABLET | Freq: Every day | ORAL | 1 refills | Status: DC

## 2018-02-15 NOTE — Progress Notes (Signed)
   HPI:  Nathan Garcia Lucchesi is a 42 y.o. male patient of Dr Allyson SabalBerry, with a PMH significant for hypertension, obesity and OSA who presents today for blood pressure follow-up. During most recent OV I changed his losartan to valsartan 160mg  for better BP control. He presents to clinic today and denies dizziness, increased fatigue, blurry vision, or chest pain.  Blood Pressure Goal:  130/80 mmHg  Current Medications:  Valsartan160 mg every mornig  Chlorthalidone 25 every morning  Family History:  Mother belied to have hypertension for many years, he currently has no contact  Father just now starting with hypertension- in his late 5060s  No other known cardiac history  Social History:  Smoker, trying to reduce (now averaging 4-6 per day)  Previously drank 3-5 drinks per day (vodka) 6 days per week  - now keeping it at 2-3 drinks   1 cup of black cofffe per day, no soda  Diet:  Patient does not cook at home. [employer] for dinner several nights each week, lunch served at work [employer]); doesn't add salt to any foods; cut back on fried foods; admits to eating a good supply of veggies/salads.   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 chlorthalidone (HYGROTON) 25 MG tablet Take 1 tablet (25 mg total) by mouth daily. 90 tablet 1  . valsartan (DIOVAN) 160 MG tablet Take 1.5 tablets (240 mg total) by mouth daily. 45 tablet 1   No current facility-administered medications for this  visit.     Allergies  Allergen Reactions  . Amlodipine     Low extremity edema    Past Medical History:  Diagnosis Date  . Hypertension     Blood pressure (!) 138/92, pulse 88.  Essential hypertension BP remains above desired goal of <130/80 with diastolic pressure consistently in the 90s. Will increase valsartan to 240mg  today ad continue to monitor BP 2-3x/week. Plan to increase valsartan to 320mg  daily if patient able to tolerate. Due to Mr Ferne CoeReynold's work schedule, I will follow up with him by phone in 3-4 weeks, and then determine if additional OV needed.  Avyn Coate Rodriguez-Guzman PharmD, BCPS, CPP [facility] [address] [place] [phone] 02/15/2018 8:16 AM

## 2018-02-15 NOTE — Patient Instructions (Signed)
Return for a follow up appointment as needed  Check your blood pressure at home daily (if able) and keep record of the readings.  Take your BP meds as follows: *INCREASE valsartan to 240mg  daily*  Bring all of your meds, your BP cuff and your record of home blood pressures to your next appointment.  Exercise as you're able, try to walk approximately 30 minutes per day.  Keep salt intake to a minimum, especially watch canned and prepared boxed foods.  Eat more fresh fruits and vegetables and fewer canned items.  Avoid eating in fast food restaurants.    HOW TO TAKE YOUR BLOOD PRESSURE: . Rest 5 minutes before taking your blood pressure. .  Don't smoke or drink caffeinated beverages for at least 30 minutes before. . Take your blood pressure before (not after) you eat. . Sit comfortably with your back supported and both feet on the floor (don't cross your legs). . Elevate your arm to heart level on a table or a desk. . Use the proper sized cuff. It should fit smoothly and snugly around your bare upper arm. There should be enough room to slip a fingertip under the cuff. The bottom edge of the cuff should be 1 inch above the crease of the elbow. . Ideally, take 3 measurements at one sitting and record the average.

## 2018-02-15 NOTE — Assessment & Plan Note (Signed)
BP remains above desired goal of <130/80 with diastolic pressure consistently in the 90s. Will increase valsartan to 240mg  today ad continue to monitor BP 2-3x/week. Plan to increase valsartan to 320mg  daily if patient able to tolerate. Due to Mr Nathan Garcia's work schedule, I will follow up with him by phone in 3-4 weeks, and then determine if additional OV needed.

## 2018-03-07 ENCOUNTER — Other Ambulatory Visit: Payer: Self-pay | Admitting: Pharmacist

## 2018-03-07 MED ORDER — VALSARTAN 160 MG PO TABS: 240.0000 mg | ORAL_TABLET | Freq: Every day | ORAL | 1 refills | Status: DC

## 2018-03-14 ENCOUNTER — Telehealth: Payer: Self-pay | Admitting: Pharmacist

## 2018-03-14 NOTE — Telephone Encounter (Signed)
Pt tolerating most recent dose increase of valsartan. Scheduled office visit for 10/10

## 2018-04-11 NOTE — Progress Notes (Signed)
    HPI:  Nathan Garcia is a 42 y.o. male patient of Dr Allyson Sabal, with a PMH significant for hypertension, obesity and OSA who presents today for blood pressure follow-up. During most recent office visit I increased his valsartan dose to 240mg  for better BP control. He presents to clinic today and denies dizziness, increased fatigue, blurry vision, or chest pain.  He is drinking less alcohol and smoking 2-3 cigarettes per day (down from 6 per day).   Blood Pressure Goal:  130/80 mmHg  Current Medications:  Valsartan 240 mg every mornig  Chlorthalidone 25 every morning  Family History:  Mother belied to have hypertension for many years, he currently has no contact  Father just now starting with hypertension- in his late 72s  No other known cardiac history  Social History:  Smoker, trying to reduce (now averaging 4-6 per day)  Previously drank 3-5 drinks per day (vodka) 6 days per week  - now keeping it at 2-3 drinks   1 cup of black cofffe per day, no soda  Diet:  Patient does not cook at home. [employer] for dinner several nights each week, lunch served at work [employer]); doesn't add salt to any foods; cut back on fried foods; admits to eating a good supply of veggies/salads.   Exercise:  Does not exercise although he walks most of the day at his work [employer])  Home BP readings: none provided today    Wt Readings from Last 3 Encounters:  11/07/17 (!) 304 lb 9.6 oz (138.2 kg)  10/19/17 (!) 306 lb (138.8 kg)  09/11/17 (!) 305 lb (138.3 kg)   BP Readings from Last 3 Encounters:  04/12/18 122/80  02/15/18 (!) 138/92  01/16/18 124/88   Pulse Readings from Last 3 Encounters:  04/12/18 70  02/15/18 88  01/16/18 88    Current Outpatient Medications  Medication Sig Dispense Refill  . chlorthalidone (HYGROTON) 25 MG tablet Take 1 tablet (25 mg total) by mouth daily. 90 tablet 1  . valsartan (DIOVAN) 160 MG tablet Take 1.5 tablets (240 mg total) by mouth daily.  135 tablet 1   No current facility-administered medications for this visit.     Allergies  Allergen Reactions  . Amlodipine     Low extremity edema    Past Medical History:  Diagnosis Date  . Hypertension     Blood pressure 122/80, pulse 70.  Essential hypertension BP at goal today and patient working on positive lifestyle modifications. Patient denies problems with medication or elevated BP at home. Will continue current therapy without changes and follow up as needed. BMET repeat today due to change in valsartan dose. Will call patient once results available.   Shreeya Recendiz Rodriguez-Guzman PharmD, BCPS, CPP [facility] [address] [place] [address] 04/12/2018 9:04 AM

## 2018-04-12 ENCOUNTER — Ambulatory Visit (INDEPENDENT_AMBULATORY_CARE_PROVIDER_SITE_OTHER): Payer: Commercial Managed Care - PPO | Admitting: Pharmacist

## 2018-04-12 VITALS — BP 122/80 | HR 70

## 2018-04-12 DIAGNOSIS — I10 Essential (primary) hypertension: Secondary | ICD-10-CM | POA: Diagnosis not present

## 2018-04-12 LAB — BASIC METABOLIC PANEL
BUN/Creatinine Ratio: 18 (ref 9–20)
BUN: 17 mg/dL (ref 6–24)
CHLORIDE: 96 mmol/L (ref 96–106)
CO2: 25 mmol/L (ref 20–29)
Calcium: 9.8 mg/dL (ref 8.7–10.2)
Creatinine, Ser: 0.94 mg/dL (ref 0.76–1.27)
GFR calc non Af Amer: 100 mL/min/{1.73_m2} (ref >59)
GFR, EST AFRICAN AMERICAN: 116 mL/min/{1.73_m2} (ref >59)
Glucose: 91 mg/dL (ref 65–99)
POTASSIUM: 3.9 mmol/L (ref 3.5–5.2)
SODIUM: 137 mmol/L (ref 134–144)

## 2018-04-12 NOTE — Patient Instructions (Signed)
Return for a  follow up appointment as needed  Go to the lab in TODAY  Check your blood pressure at home daily (if able) and keep record of the readings.  Take your BP meds as follows: *CONTINUE all medication as prescribed*  Bring all of your meds, your BP cuff and your record of home blood pressures to your next appointment.  Exercise as you're able, try to walk approximately 30 minutes per day.  Keep salt intake to a minimum, especially watch canned and prepared boxed foods.  Eat more fresh fruits and vegetables and fewer canned items.  Avoid eating in fast food restaurants.    HOW TO TAKE YOUR BLOOD PRESSURE: . Rest 5 minutes before taking your blood pressure. .  Don't smoke or drink caffeinated beverages for at least 30 minutes before. . Take your blood pressure before (not after) you eat. . Sit comfortably with your back supported and both feet on the floor (don't cross your legs). . Elevate your arm to heart level on a table or a desk. . Use the proper sized cuff. It should fit smoothly and snugly around your bare upper arm. There should be enough room to slip a fingertip under the cuff. The bottom edge of the cuff should be 1 inch above the crease of the elbow. . Ideally, take 3 measurements at one sitting and record the average.

## 2018-04-12 NOTE — Assessment & Plan Note (Signed)
BP at goal today and patient working on positive lifestyle modifications. Patient denies problems with medication or elevated BP at home. Will continue current therapy without changes and follow up as needed. BMET repeat today due to change in valsartan dose. Will call patient once results available.

## 2018-06-12 ENCOUNTER — Encounter (HOSPITAL_COMMUNITY): Payer: Self-pay

## 2018-06-12 ENCOUNTER — Ambulatory Visit (HOSPITAL_COMMUNITY)
Admission: EM | Admit: 2018-06-12 | Discharge: 2018-06-12 | Disposition: A | Discharge status: Home / Self Care | Payer: Commercial Managed Care - PPO | Attending: Family Medicine | Admitting: Family Medicine

## 2018-06-12 DIAGNOSIS — J019 Acute sinusitis, unspecified: Secondary | ICD-10-CM | POA: Diagnosis not present

## 2018-06-12 DIAGNOSIS — B349 Viral infection, unspecified: Secondary | ICD-10-CM | POA: Insufficient documentation

## 2018-06-12 MED ORDER — AMOXICILLIN-POT CLAVULANATE 875-125 MG PO TABS: 1.0000 | ORAL_TABLET | Freq: Two times a day (BID) | ORAL | 0 refills | Status: DC

## 2018-06-12 NOTE — ED Provider Notes (Signed)
[facility]    CSN: [id number] Arrival date & time: 06/12/18  1640     History   Chief Complaint Chief Complaint  Patient presents with  . Facial Pain  . Fever    HPI Nathan Garcia is a 42 y.o. male.   HPI  Has sinus infection about once a year Feels he usually requires an antibiotic to recover Now with sinus pressure and pain, headache, fever, fatigue , ear pressure, mild sore throat for 4 days.  Stayed home from work today. Has been taking mucinex DM and dayquil/nyquil products BOP and pulse a little elevated  Past Medical History:  Diagnosis Date  . Hypertension     Patient Active Problem List   Diagnosis Date Noted  . Tobacco abuse 11/07/2017  . Ankle fracture, left 08/14/2017  . Obstructive sleep apnea 01/06/2017  . Essential hypertension 12/22/2016  . Abscess of right axilla 12/22/2016    History reviewed. No pertinent surgical history.     Home Medications    Prior to Admission medications   Medication Sig Start Date End Date Taking? Authorizing Provider  amoxicillin-clavulanate (AUGMENTIN) 875-125 MG tablet Take 1 tablet by mouth every 12 (twelve) hours. 06/12/18   Eustace Moore, MD  chlorthalidone (HYGROTON) 25 MG tablet Take 1 tablet (25 mg total) by mouth daily. 12/19/17   Runell Gess, MD  valsartan (DIOVAN) 160 MG tablet Take 1.5 tablets (240 mg total) by mouth daily. 03/07/18   Runell Gess, MD    Family History History reviewed. No pertinent family history.  Social History Social History   Tobacco Use  . Smoking status: Current Every Day Smoker    Packs/day: 0.50    Types: Cigarettes  . Smokeless tobacco: Never Used  Substance Use Topics  . Alcohol use: Yes  . Drug use: No     Allergies   Amlodipine   Review of Systems Review of Systems  Constitutional: Positive for fatigue. Negative for chills and fever.  HENT: Positive for congestion. Negative for ear pain and sore throat.   Eyes: Negative for  pain and visual disturbance.  Respiratory: Negative for cough and shortness of breath.   Cardiovascular: Negative for chest pain and palpitations.  Gastrointestinal: Negative for abdominal pain and vomiting.  Genitourinary: Negative for dysuria and hematuria.  Musculoskeletal: Negative for arthralgias and back pain.  Skin: Negative for color change and rash.  Neurological: Negative for seizures and syncope.  All other systems reviewed and are negative.    Physical Exam Triage Vital Signs ED Triage Vitals  Enc Vitals Group     BP 06/12/18 1729 (!) 147/99     Pulse Rate 06/12/18 1729 (!) 101     Resp 06/12/18 1729 16     Temp 06/12/18 1729 99 F (37.2 C)     Temp Source 06/12/18 1729 Oral     SpO2 06/12/18 1729 97 %     Weight --      Height --      Head Circumference --      Peak Flow --      Pain Score 06/12/18 1731 4     Pain Loc --      Pain Edu? --      Excl. in GC? --    No data found.  Updated Vital Signs BP (!) 147/99 (BP Location: Right Arm)   Pulse (!) 101   Temp 99 F (37.2 C) (Oral)   Resp 16   SpO2 97%  Physical Exam  Constitutional: He appears well-developed and well-nourished. He appears distressed.  Clammy skin.  Appears uncomfortable  HENT:  Head: Normocephalic and atraumatic.  Right Ear: External ear normal.  Left Ear: External ear normal.  Mouth/Throat: Oropharynx is clear and moist.  nasal memb red and swollen.  Sinuses tender.  Throat red  Eyes: Pupils are equal, round, and reactive to light. Conjunctivae are normal.  Neck: Normal range of motion.  Cardiovascular: Normal rate, regular rhythm and normal heart sounds.  Pulmonary/Chest: Effort normal and breath sounds normal. No respiratory distress. He has no wheezes.  Abdominal: Soft. Bowel sounds are normal. He exhibits no distension.  Musculoskeletal: Normal range of motion. He exhibits no edema.  Lymphadenopathy:    He has cervical adenopathy.  Neurological: He is alert.  Skin:  Skin is warm and dry.  Psychiatric: He has a normal mood and affect. His behavior is normal.     UC Treatments / Results  Labs (all labs ordered are listed, but only abnormal results are displayed) Labs Reviewed - No data to display  EKG None  Radiology No results found.  Procedures Procedures (including critical care time)  Medications Ordered in UC Medications - No data to display  Initial Impression / Assessment and Plan / UC Course  I have reviewed the triage vital signs and the nursing notes.  Pertinent labs & imaging results that were available during my care of the patient were reviewed by me and considered in my medical decision making (see chart for details).     Reviewed that most sinus infections are viruses and require only symptomatic care Final Clinical Impressions(s) / UC Diagnoses   Final diagnoses:  Viral illness  Acute sinusitis, recurrence not specified, unspecified location     Discharge Instructions     Drink plenty of fluids Ibuprofen or acetaminophen for pain and fever Continue Mucinex DM Add Flonase twice a day until symptoms improve Use a humidifier if you have one  Most sinus infections are caused by a virus.  Antibiotics are indicated if your symptoms are prolonged (over 8 to 10 days) or accompanied by severe symptoms (temperature over 102).  I am giving you a written prescription for an antibiotic to fill and use if you fail to improve.   ED Prescriptions    Medication Sig Dispense Auth. Provider   amoxicillin-clavulanate (AUGMENTIN) 875-125 MG tablet Take 1 tablet by mouth every 12 (twelve) hours. 14 tablet Eustace MooreNelson, Tyasia Packard Sue, MD     Controlled Substance Prescriptions [place] Controlled Substance Registry consulted? Not Applicable   Eustace MooreNelson, Sharlize Hoar Sue, MD 06/12/18 [phone]

## 2018-06-12 NOTE — ED Triage Notes (Signed)
Pt present sinus pressure and fever that started Saturday.

## 2018-06-12 NOTE — Discharge Instructions (Signed)
Drink plenty of fluids Ibuprofen or acetaminophen for pain and fever Continue Mucinex DM Add Flonase twice a day until symptoms improve Use a humidifier if you have one  Most sinus infections are caused by a virus.  Antibiotics are indicated if your symptoms are prolonged (over 8 to 10 days) or accompanied by severe symptoms (temperature over 102).  I am giving you a written prescription for an antibiotic to fill and use if you fail to improve.

## 2018-07-04 ENCOUNTER — Other Ambulatory Visit: Payer: Self-pay | Admitting: Cardiovascular Disease

## 2018-09-02 ENCOUNTER — Other Ambulatory Visit: Payer: Self-pay | Admitting: Cardiovascular Disease

## 2018-10-05 ENCOUNTER — Other Ambulatory Visit: Payer: Self-pay | Admitting: Cardiovascular Disease

## 2018-10-26 ENCOUNTER — Telehealth: Payer: Commercial Managed Care - PPO | Admitting: Physician Assistant

## 2018-10-26 DIAGNOSIS — J069 Acute upper respiratory infection, unspecified: Secondary | ICD-10-CM

## 2018-10-26 DIAGNOSIS — B9789 Other viral agents as the cause of diseases classified elsewhere: Principal | ICD-10-CM

## 2018-10-26 MED ORDER — FLUTICASONE PROPIONATE 50 MCG/ACT NA SUSP: 2.0000 | Freq: Every day | NASAL | 0 refills | Status: DC

## 2018-10-26 MED ORDER — BENZONATATE 100 MG PO CAPS: 100.0000 mg | ORAL_CAPSULE | Freq: Three times a day (TID) | ORAL | 0 refills | Status: DC | PRN

## 2018-10-26 NOTE — Progress Notes (Signed)
We are sorry you are not feeling well.  Here is how we plan to help!  Based on what you have shared with me, it looks like you may have a viral upper respiratory infection.  Upper respiratory infections are caused by a large number of viruses; however, rhinovirus is the most common cause.   Symptoms vary from person to person, with common symptoms including sore throat, cough, and fatigue or lack of energy.  A low-grade fever of up to 100.4 may present, but is often uncommon.  Symptoms vary however, and are closely related to a person's age or underlying illnesses.  The most common symptoms associated with an upper respiratory infection are nasal discharge or congestion, cough, sneezing, headache and pressure in the ears and face.  These symptoms usually persist for about 3 to 10 days, but can last up to 2 weeks.  It is important to know that upper respiratory infections do not cause serious illness or complications in most cases.    Upper respiratory infections can be transmitted from person to person, with the most common method of transmission being a person's hands.  The virus is able to live on the skin and can infect other persons for up to 2 hours after direct contact.  Also, these can be transmitted when someone coughs or sneezes; thus, it is important to cover the mouth to reduce this risk.  To keep the spread of the illness at bay, good hand hygiene is very important.  This is an infection that is most likely caused by a virus. There are no specific treatments other than to help you with the symptoms until the infection runs its course.  We are sorry you are not feeling well.  Here is how we plan to help!   For nasal congestion, you may use an oral decongestants such as Mucinex D or if you have glaucoma or high blood pressure use plain Mucinex.  Saline nasal spray or nasal drops can help and can safely be used as often as needed for congestion.  For your congestion, I have prescribed Fluticasone  nasal spray one spray in each nostril twice a day  If you do not have a history of heart disease, hypertension, diabetes or thyroid disease, prostate/bladder issues or glaucoma, you may also use Sudafed to treat nasal congestion.  It is highly recommended that you consult with a pharmacist or your primary care physician to ensure this medication is safe for you to take.     If you have a cough, you may use cough suppressants such as Delsym and Robitussin.  If you have glaucoma or high blood pressure, you can also use Coricidin HBP.   For cough I have prescribed for you A prescription cough medication called Tessalon Perles 100 mg. You may take 1-2 capsules every 8 hours as needed for cough  If you have a sore or scratchy throat, use a saltwater gargle-  to  teaspoon of salt dissolved in a 4-ounce to 8-ounce glass of warm water.  Gargle the solution for approximately 15-30 seconds and then spit.  It is important not to swallow the solution.  You can also use throat lozenges/cough drops and Chloraseptic spray to help with throat pain or discomfort.  Warm or cold liquids can also be helpful in relieving throat pain.  For headache, pain or general discomfort, you can use Ibuprofen or Tylenol as directed.   Some authorities believe that zinc sprays or the use of Echinacea may shorten the   course of your symptoms.   HOME CARE . Only take medications as instructed by your medical team. . Be sure to drink plenty of fluids. Water is fine as well as fruit juices, sodas and electrolyte beverages. You may want to stay away from caffeine or alcohol. If you are nauseated, try taking small sips of liquids. How do you know if you are getting enough fluid? Your urine should be a pale yellow or almost colorless. . Get rest. . Taking a steamy shower or using a humidifier may help nasal congestion and ease sore throat pain. You can place a towel over your head and breathe in the steam from hot water coming from a  faucet. . Using a saline nasal spray works much the same way. . Cough drops, hard candies and sore throat lozenges may ease your cough. . Avoid close contacts especially the very young and the elderly . Cover your mouth if you cough or sneeze . Always remember to wash your hands.   GET HELP RIGHT AWAY IF: . You develop worsening fever. . If your symptoms do not improve within 10 days . You develop yellow or green discharge from your nose over 3 days. . You have coughing fits . You develop a severe head ache or visual changes. . You develop shortness of breath, difficulty breathing or start having chest pain . Your symptoms persist after you have completed your treatment plan  MAKE SURE YOU   Understand these instructions.  Will watch your condition.  Will get help right away if you are not doing well or get worse.  Your e-visit answers were reviewed by a board certified advanced clinical practitioner to complete your personal care plan. Depending upon the condition, your plan could have included both over the counter or prescription medications. Please review your pharmacy choice. If there is a problem, you may call our nursing hot line at and have the prescription routed to another pharmacy. Your safety is important to [place]. If you have drug allergies check your prescription carefully.   You can use MyChart to ask questions about today's visit, request a non-urgent call back, or ask for a work or school excuse for 24 hours related to this e-Visit. If it has been greater than 24 hours you will need to follow up with your provider, or enter a new e-Visit to address those concerns. You will get an e-mail in the next two days asking about your experience.  I hope that your e-visit has been valuable and will speed your recovery. Thank you for using e-visits.   I have spent 5 minutes in review of e-visit questionnaire, review and updating patient chart, medical decision making and response  to patient.    Benjiman Core, PA-C

## 2019-01-01 ENCOUNTER — Ambulatory Visit (INDEPENDENT_AMBULATORY_CARE_PROVIDER_SITE_OTHER): Payer: Commercial Managed Care - PPO | Admitting: Osteopathic Medicine

## 2019-01-01 ENCOUNTER — Encounter: Payer: Self-pay | Admitting: Osteopathic Medicine

## 2019-01-01 ENCOUNTER — Telehealth: Payer: Self-pay | Admitting: Osteopathic Medicine

## 2019-01-01 VITALS — BP 157/99 | HR 93

## 2019-01-01 DIAGNOSIS — J029 Acute pharyngitis, unspecified: Secondary | ICD-10-CM

## 2019-01-01 DIAGNOSIS — Z20828 Contact with and (suspected) exposure to other viral communicable diseases: Secondary | ICD-10-CM | POA: Diagnosis not present

## 2019-01-01 DIAGNOSIS — R51 Headache: Secondary | ICD-10-CM | POA: Diagnosis not present

## 2019-01-01 DIAGNOSIS — Z20822 Contact with and (suspected) exposure to covid-19: Secondary | ICD-10-CM

## 2019-01-01 NOTE — Telephone Encounter (Signed)
Can we set this patient up for drive through testing? Thanks.  Known (+)COVID in coworker

## 2019-01-01 NOTE — Progress Notes (Signed)
Virtual Visit via Video (App used: Doximity) Note  I connected with      Nathan Garcia on 01/01/19 at 2:14 PM  by a telemedicine application and verified that I am speaking with the correct person using two identifiers.  Patient is at home I am in office    I discussed the limitations of evaluation and management by telemedicine and the availability of in person appointments. The patient expressed understanding and agreed to proceed.  History of Present Illness: Nathan Garcia is a 43 y.o. male who would like to discuss COVID testing    Known exposure to co-worker who tested (+)COVID Patient has headache, sore throat, loose stool for few days.  No fever/chills        Observations/Objective: BP (!) 157/99 (Patient Position: Sitting, Cuff Size: Normal)   Pulse 93  BP Readings from Last 3 Encounters:  01/01/19 (!) 157/99  06/12/18 (!) 147/99  04/12/18 122/80   Exam: Normal Speech.  NAD  Lab and Radiology Results No results found for this or any previous visit (from the past 72 hour(s)). No results found.     Assessment and Plan: 43 y.o. male with The encounter diagnosis was Suspected Covid-19 Virus Infection.  Message sent to [facility] to get set up for testing.    Follow Up Instructions: Return if symptoms worsen or fail to improve.    I discussed the assessment and treatment plan with the patient. The patient was provided an opportunity to ask questions and all were answered. The patient agreed with the plan and demonstrated an understanding of the instructions.   The patient was advised to call back or seek an in-person evaluation if any new concerns, if symptoms worsen or if the condition fails to improve as anticipated.  15 minutes of non-face-to-face time was provided during this encounter.                      Historical information moved to improve visibility of documentation.  Past Medical History:  Diagnosis Date  . Hypertension     History reviewed. No pertinent surgical history. Social History   Tobacco Use  . Smoking status: Current Every Day Smoker    Packs/day: 0.50    Types: Cigarettes  . Smokeless tobacco: Never Used  Substance Use Topics  . Alcohol use: Yes   family history is not on file.  Medications: Current Outpatient Medications  Medication Sig Dispense Refill  . chlorthalidone (HYGROTON) 25 MG tablet TAKE 1 TABLET(25 MG) BY MOUTH DAILY 90 tablet 1  . valsartan (DIOVAN) 160 MG tablet TAKE 1 AND 1/2 TABLETS(240 MG) BY MOUTH DAILY 45 tablet 6  . amoxicillin-clavulanate (AUGMENTIN) 875-125 MG tablet Take 1 tablet by mouth every 12 (twelve) hours. (Patient not taking: Reported on 01/01/2019) 14 tablet 0  . benzonatate (TESSALON) 100 MG capsule Take 1-2 capsules (100-200 mg total) by mouth 3 (three) times daily as needed for cough. (Patient not taking: Reported on 01/01/2019) 40 capsule 0  . fluticasone (FLONASE) 50 MCG/ACT nasal spray Place 2 sprays into both nostrils daily. (Patient not taking: Reported on 01/01/2019) 16 g 0   No current facility-administered medications for this visit.    Allergies  Allergen Reactions  . Amlodipine     Low extremity edema    PDMP not reviewed this encounter. No orders of the defined types were placed in this encounter.  No orders of the defined types were placed in this encounter.

## 2019-01-01 NOTE — Telephone Encounter (Signed)
Spoke with patient, scheduled him for COVID 19 test tomorrow at 11:15 am.  Testing protocol reviewed.

## 2019-01-02 ENCOUNTER — Other Ambulatory Visit: Payer: Self-pay | Admitting: Cardiovascular Disease

## 2019-01-02 ENCOUNTER — Other Ambulatory Visit: Payer: Commercial Managed Care - PPO

## 2019-01-02 DIAGNOSIS — Z20822 Contact with and (suspected) exposure to covid-19: Secondary | ICD-10-CM

## 2019-01-06 LAB — NOVEL CORONAVIRUS, NAA: SARS-CoV-2, NAA: NOT DETECTED

## 2019-01-28 IMAGING — DX DG ANKLE COMPLETE 3+V*L*
3 series · 3 of 3 positions shown · non-contrast
Comparison: Left ankle series of August 03, 2017

CLINICAL DATA: Follow-up of left ankle fracture.

EXAM:
LEFT ANKLE COMPLETE - 3+ VIEW

[ankle ap]
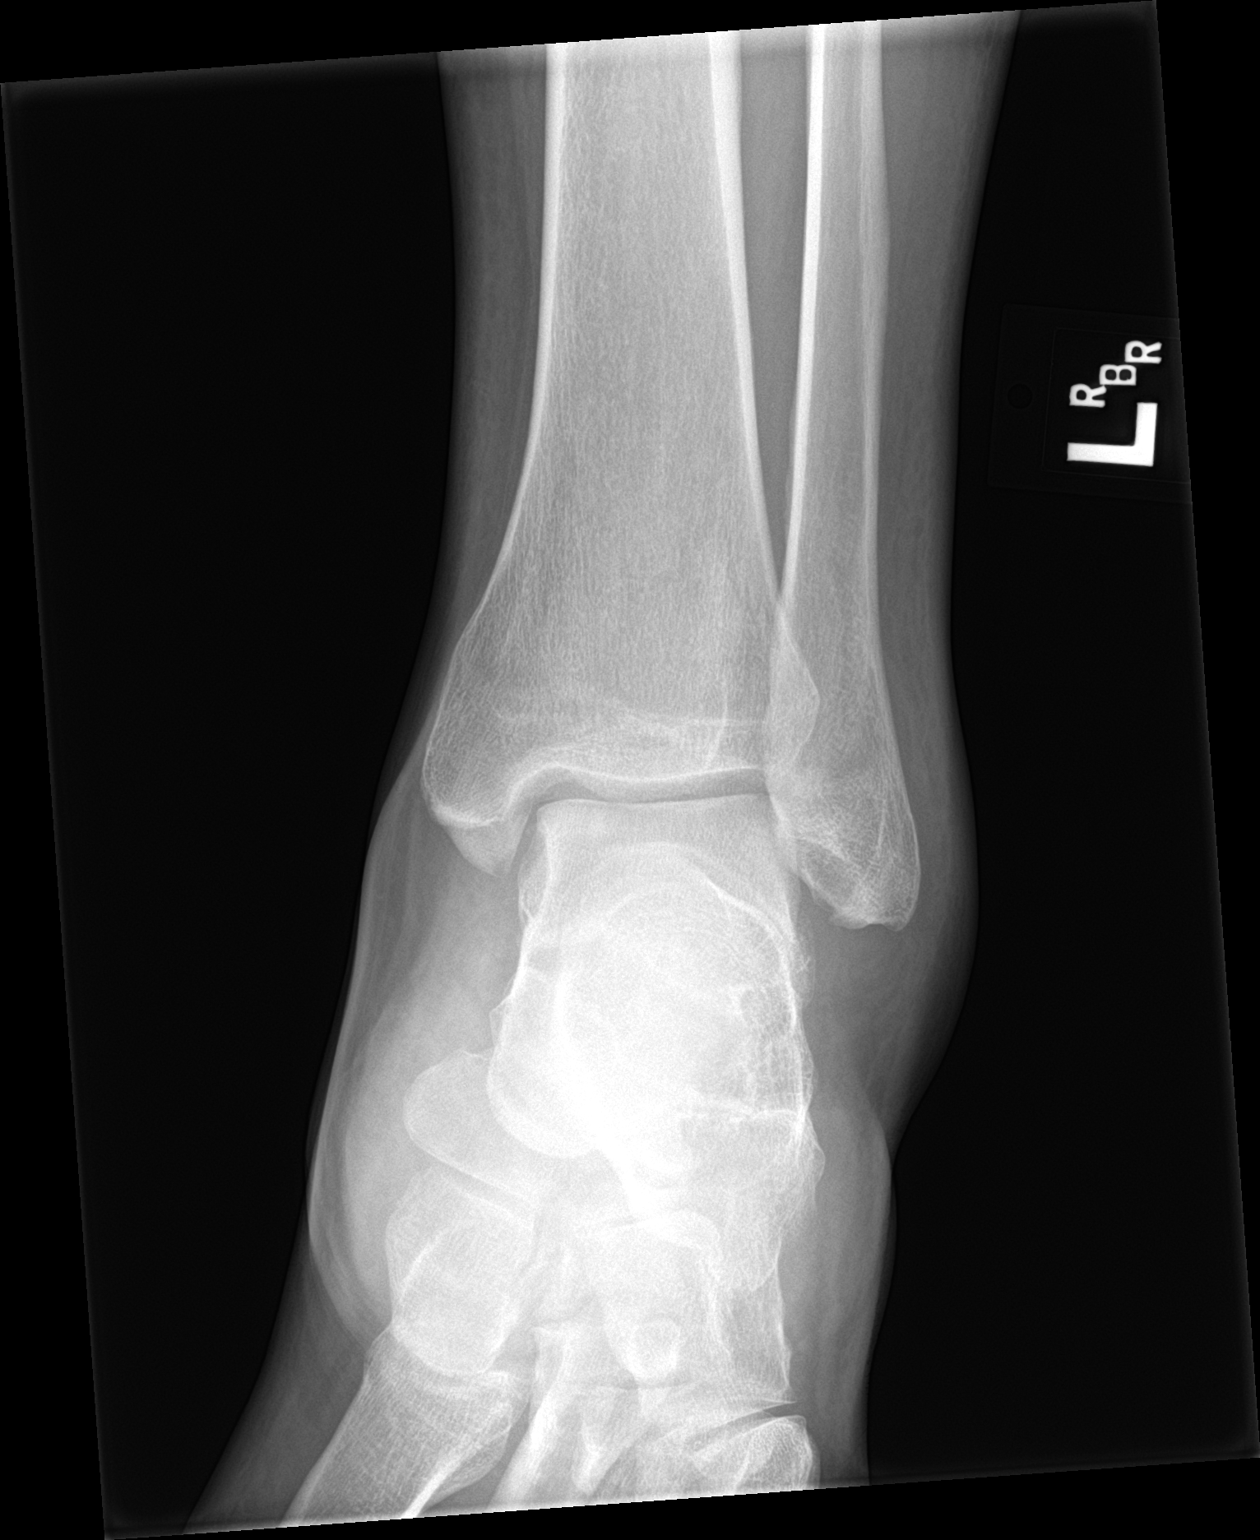

[ankle obl]
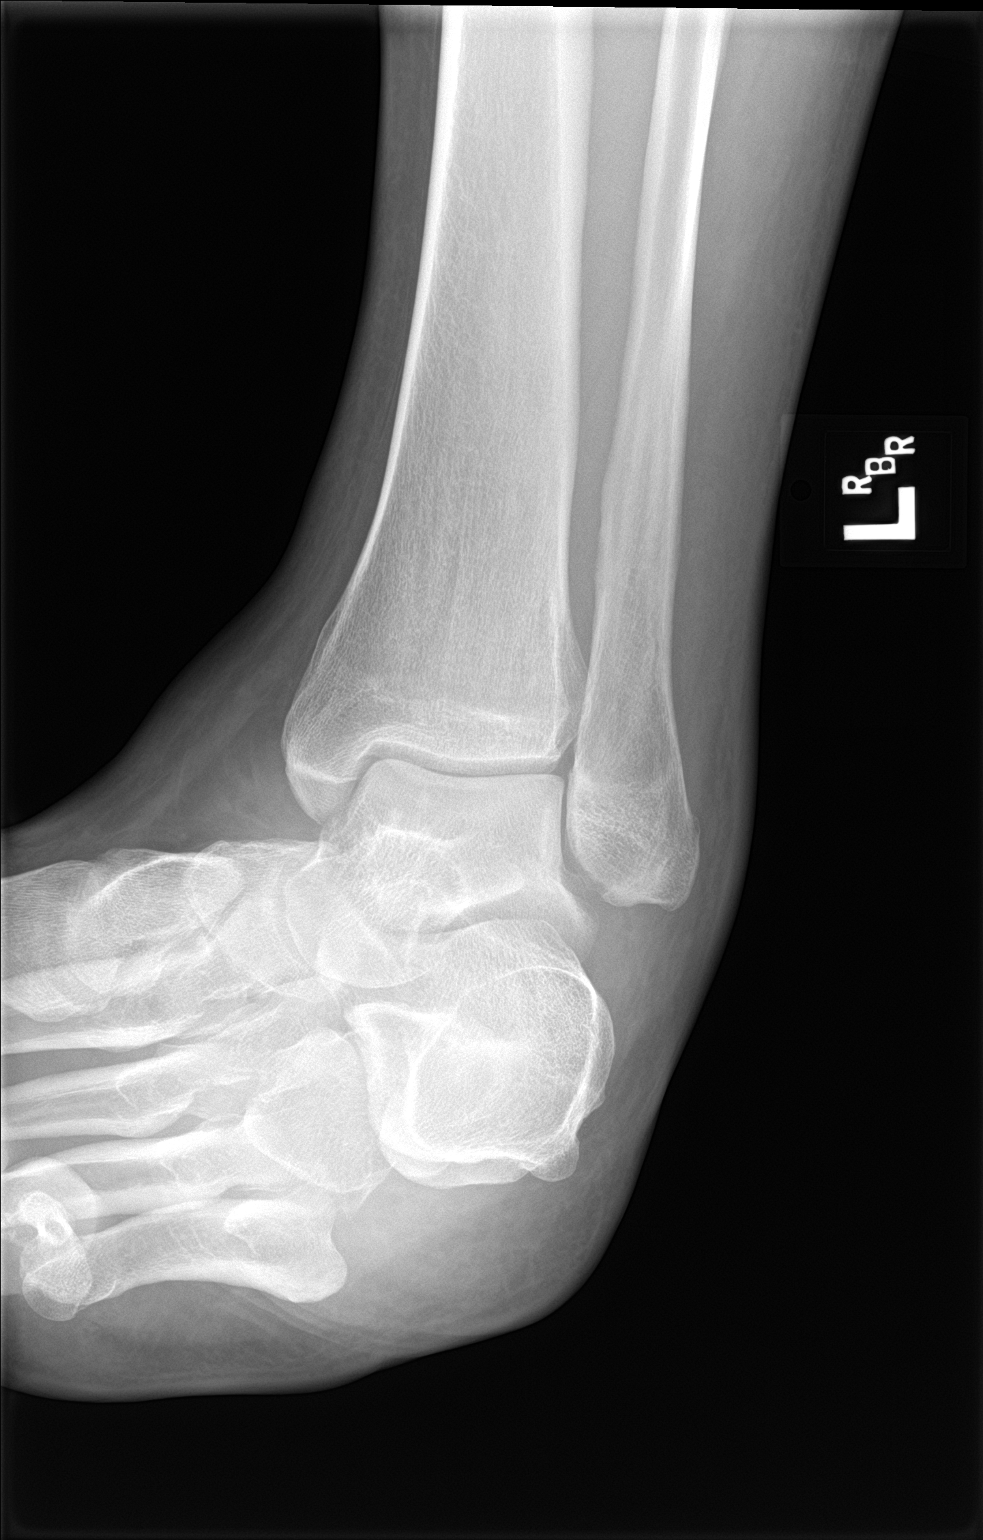

[ankle lat]
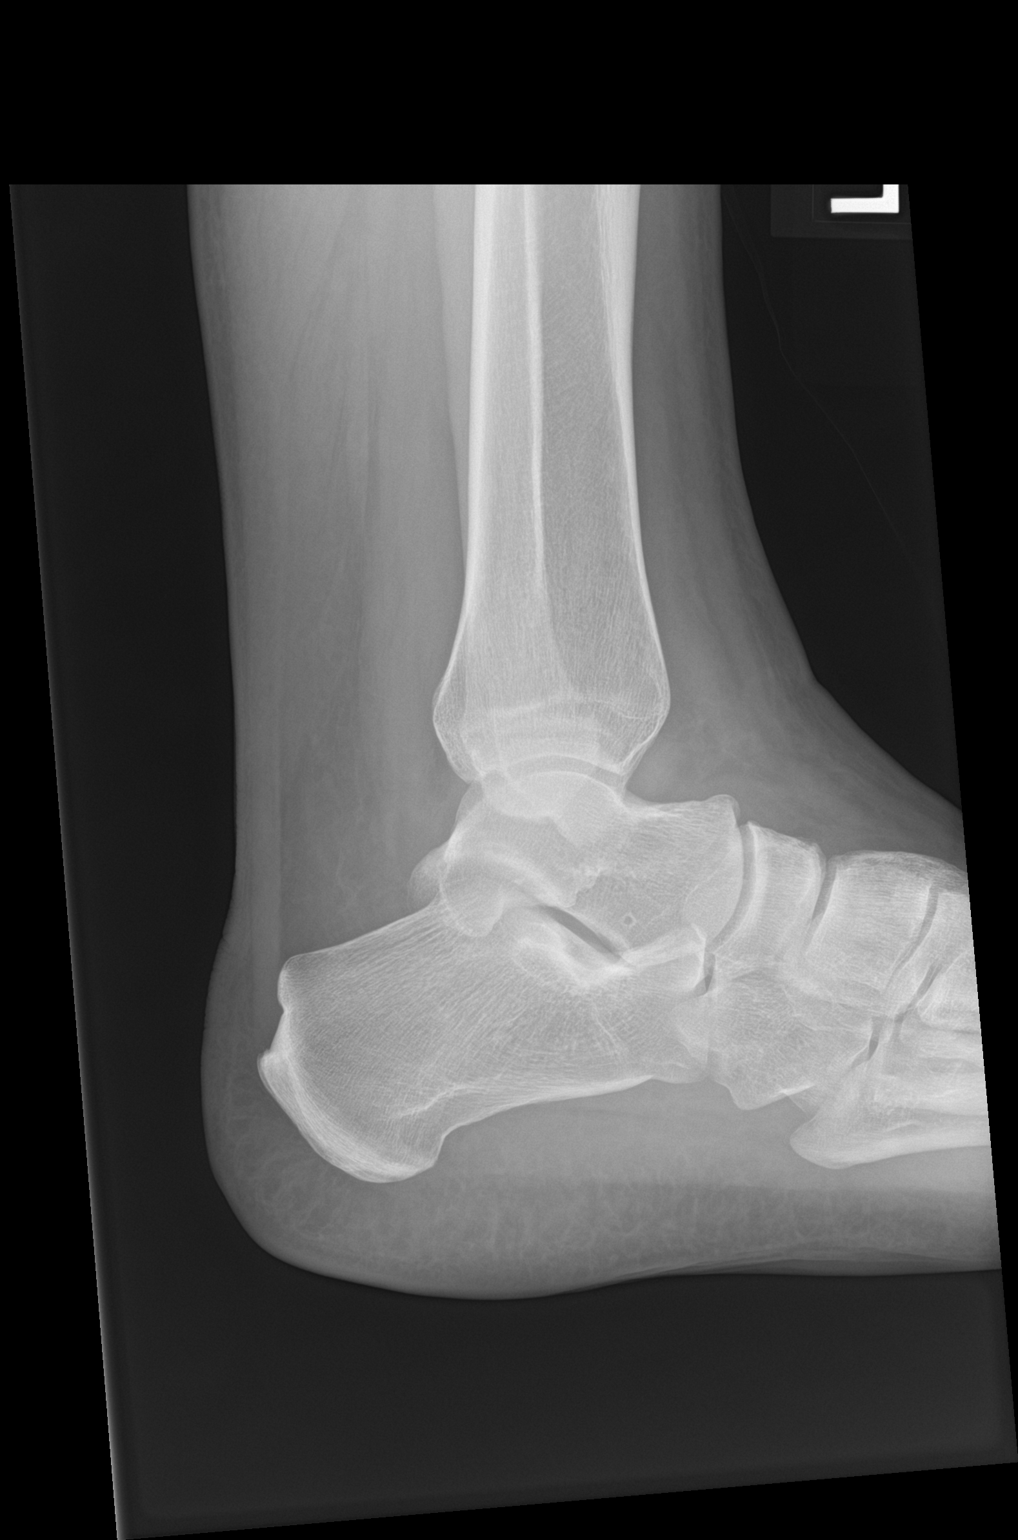

[3 of 3 positions shown; findings below may reference images not displayed]

FINDINGS: The previously demonstrated nondisplaced fracture of the posterior
aspect of the distal fibular metaphysis is no longer evident. There
remains mild generalized soft tissue swelling. The distal tibia is
intact. The joint mortise is preserved. The talus and calcaneus
exhibit no acute or healing fracture.
IMPRESSION: The previously demonstrated posterior cortex fracture of the distal
fibula is no longer evident. There is no acute bony abnormality.
There is moderate diffuse soft tissue swelling of the ankle.

## 2019-02-11 IMAGING — DX DG ANKLE COMPLETE 3+V*L*
3 series · 3 of 3 positions shown · non-contrast
Comparison: [DATE] [DATE], [DATE]; [DATE] [DATE], [DATE]

CLINICAL DATA: Limb recent fracture distal fibula

EXAM:
LEFT ANKLE COMPLETE - 3+ VIEW

[ankle ap]
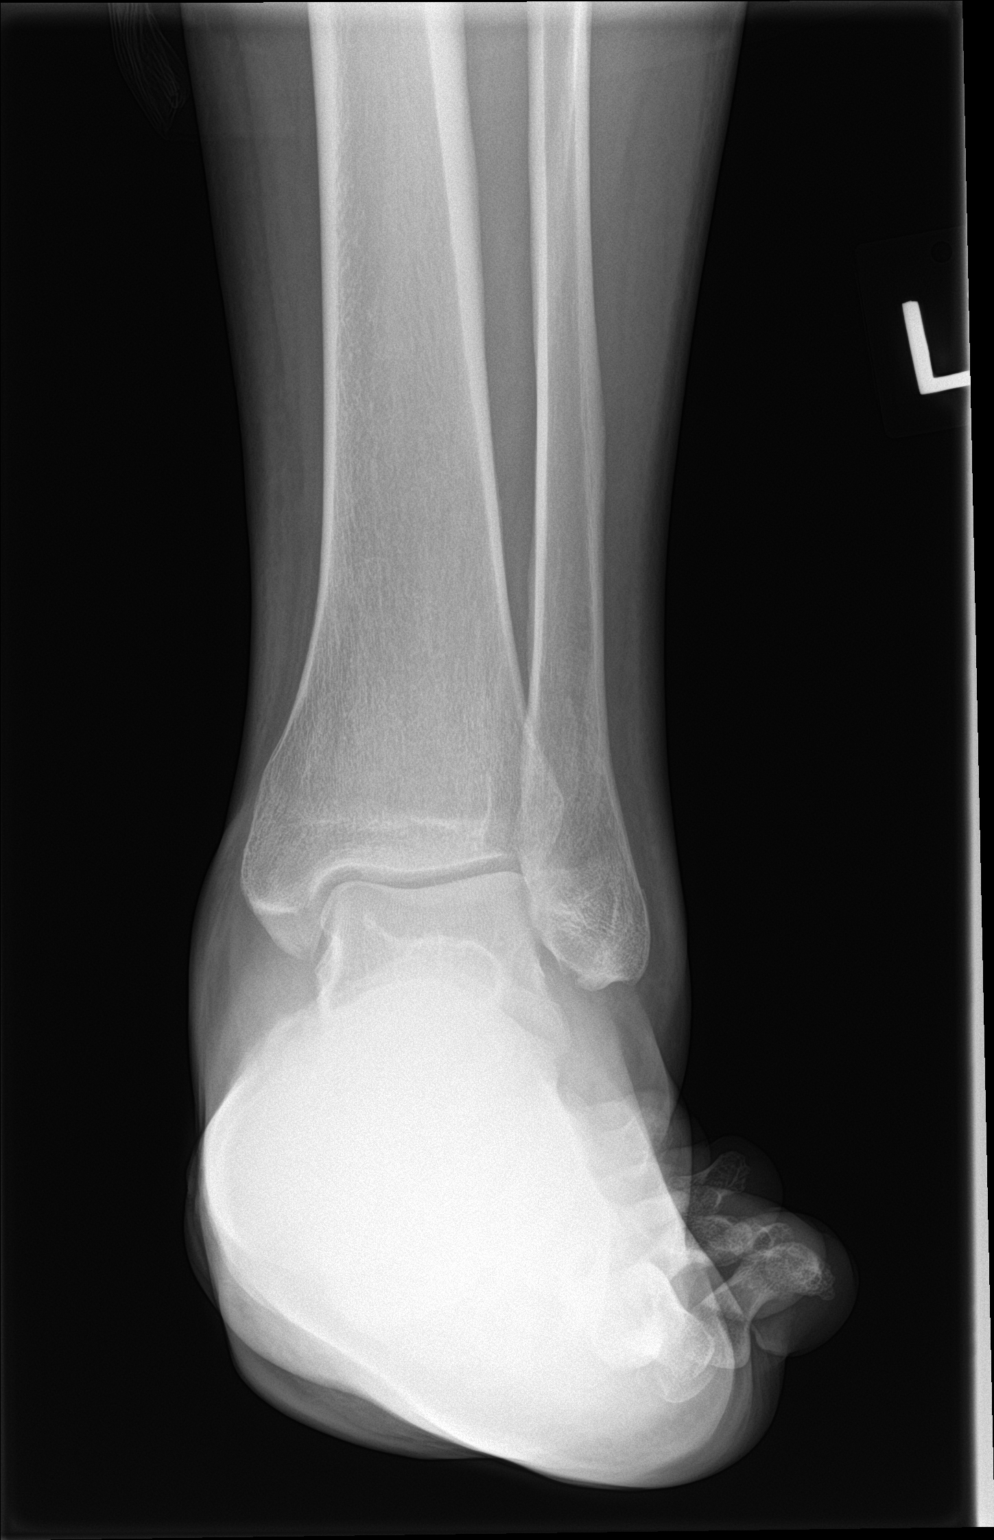

[ankle obl]
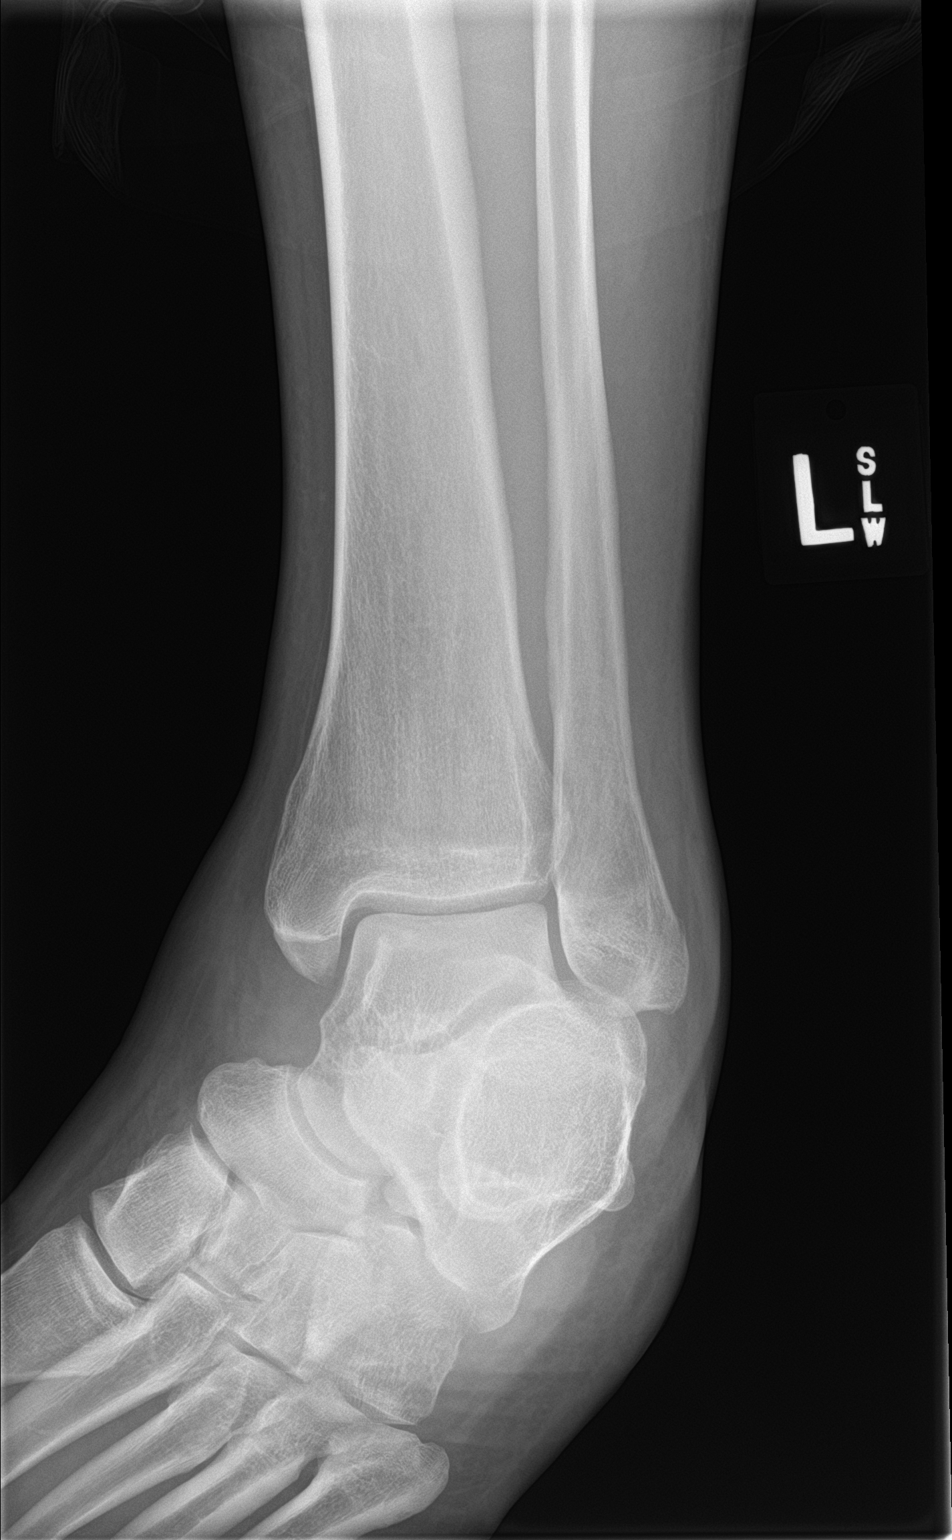

[ankle lat]
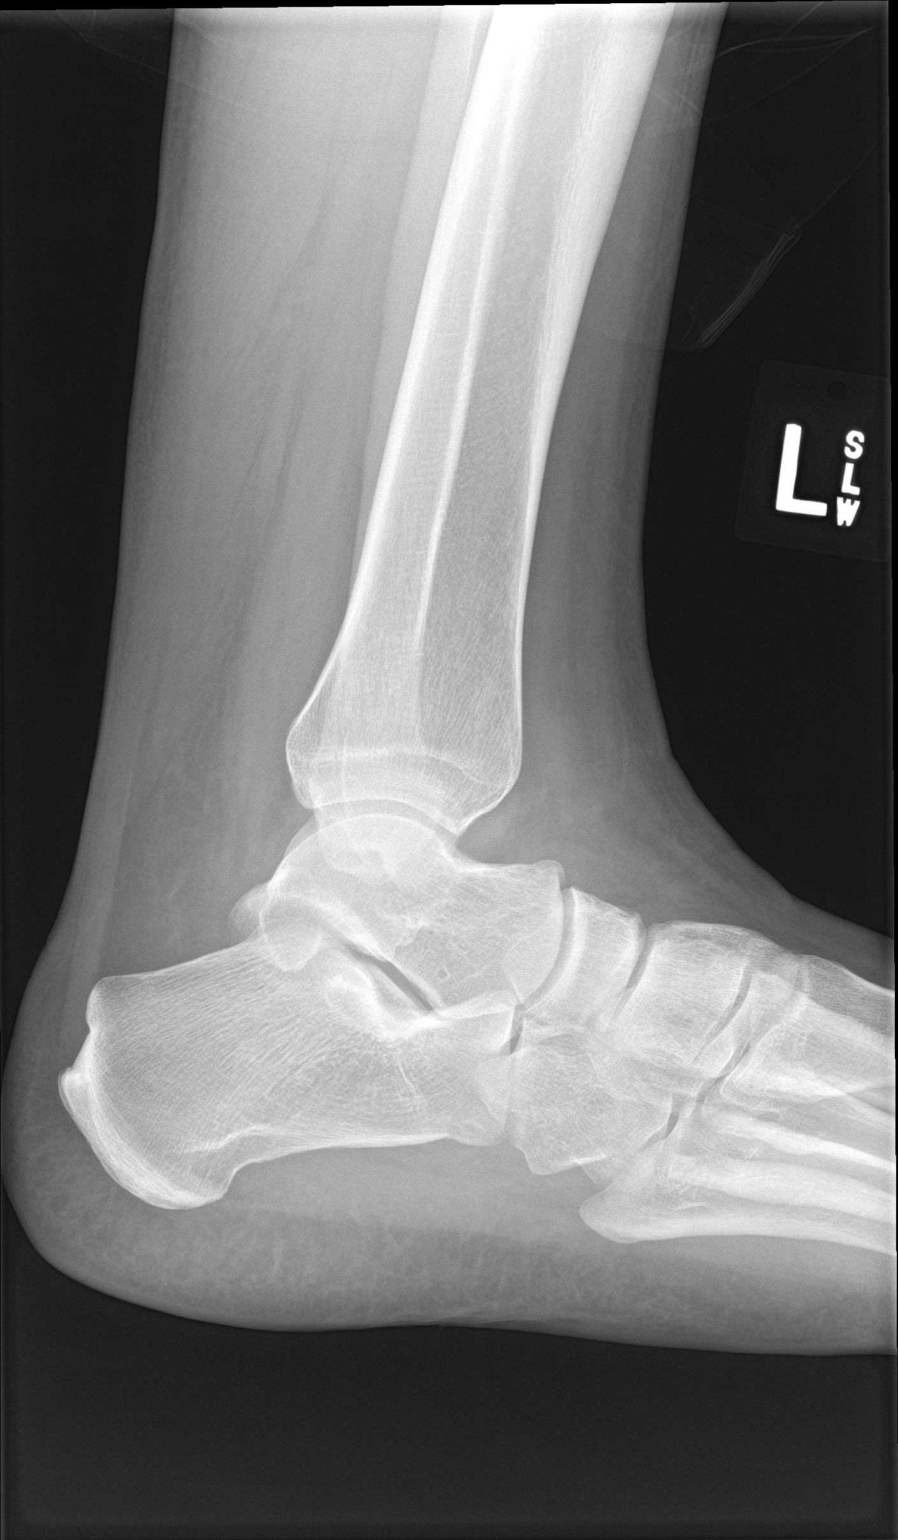

[3 of 3 positions shown; findings below may reference images not displayed]

FINDINGS: Frontal, oblique, and lateral views obtained. There is mild soft
tissue swelling. Previous apparent fracture of the distal fibula
posteriorly is not appreciable on this current examination. No
demonstrable fracture is evident on the current examination. No
joint effusion. The joint spaces appear unremarkable. No erosive
change.
IMPRESSION: Currently no fracture evident. There is soft tissue swelling. Ankle
mortise appears intact. No evident arthropathic change.

## 2019-03-02 ENCOUNTER — Other Ambulatory Visit: Payer: Self-pay | Admitting: Cardiovascular Disease

## 2019-04-02 ENCOUNTER — Telehealth: Payer: Commercial Managed Care - PPO | Admitting: Physician Assistant

## 2019-04-02 DIAGNOSIS — Z20828 Contact with and (suspected) exposure to other viral communicable diseases: Secondary | ICD-10-CM

## 2019-04-02 DIAGNOSIS — Z20822 Contact with and (suspected) exposure to covid-19: Secondary | ICD-10-CM

## 2019-04-02 MED ORDER — BENZONATATE 100 MG PO CAPS: 100.0000 mg | ORAL_CAPSULE | Freq: Three times a day (TID) | ORAL | 0 refills | Status: DC | PRN

## 2019-04-02 NOTE — Progress Notes (Signed)
E-Visit for Corona Virus Screening   Your current symptoms could be consistent with the coronavirus.  [facility] has multiple testing sites. For information on our COVID testing locations and hours go to [email or web address]  Please quarantine yourself while awaiting your test results.  We are enrolling you in our MyChart Home Montioring for COVID19 . Daily you will receive a questionnaire within the MyChart website. Our COVID 19 response team willl be monitoriing your responses daily.    COVID-19 is a respiratory illness with symptoms that are similar to the flu. Symptoms are typically mild to moderate, but there have been cases of severe illness and death due to the virus. The following symptoms may appear 2-14 days after exposure: . Fever . Cough . Shortness of breath or difficulty breathing . Chills . Repeated shaking with chills . Muscle pain . Headache . Sore throat . New loss of taste or smell . Fatigue . Congestion or runny nose . Nausea or vomiting . Diarrhea  It is vitally important that if you feel that you have an infection such as this virus or any other virus that you stay home and away from places where you may spread it to others.  You should self-quarantine for 14 days if you have symptoms that could potentially be coronavirus or have been in close contact a with a person diagnosed with COVID-19 within the last 2 weeks. You should avoid contact with people age 25 and older.   You should wear a mask or cloth face covering over your nose and mouth if you must be around other people or animals, including pets (even at home). Try to stay at least 6 feet away from other people. This will protect the people around you.  You can use medication such as A prescription cough medication called Tessalon Perles 100 mg. You may take 1-2 capsules every 8 hours as needed for cough  You may also take acetaminophen (Tylenol) as needed for fever.   Reduce your  risk of any infection by using the same precautions used for avoiding the common cold or flu:  Marland Kitchen Wash your hands often with soap and warm water for at least 20 seconds.  If soap and water are not readily available, use an alcohol-based hand sanitizer with at least 60% alcohol.  . If coughing or sneezing, cover your mouth and nose by coughing or sneezing into the elbow areas of your shirt or coat, into a tissue or into your sleeve (not your hands). . Avoid shaking hands with others and consider head nods or verbal greetings only. . Avoid touching your eyes, nose, or mouth with unwashed hands.  . Avoid close contact with people who are sick. . Avoid places or events with large numbers of people in one location, like concerts or sporting events. . Carefully consider travel plans you have or are making. . If you are planning any travel outside or inside the [place], visit the CDC's Travelers' Health webpage for the latest health notices. . If you have some symptoms but not all symptoms, continue to monitor at home and seek medical attention if your symptoms worsen. . If you are having a medical emergency, call 911.  HOME CARE . Only take medications as instructed by your medical team. . Drink plenty of fluids and get plenty of rest. . A steam or ultrasonic humidifier can help if you have congestion.   GET HELP RIGHT AWAY IF YOU HAVE EMERGENCY WARNING SIGNS** FOR COVID-19. If you or someone  is showing any of these signs seek emergency medical care immediately. Call 911 or proceed to your closest emergency facility if: . You develop worsening high fever. . Trouble breathing . Bluish lips or face . Persistent pain or pressure in the chest . New confusion . Inability to wake or stay awake . You cough up blood. . Your symptoms become more severe  **This list is not all possible symptoms. Contact your medical provider for any symptoms that are sever or concerning to you.   MAKE SURE YOU   Understand  these instructions.  Will watch your condition.  Will get help right away if you are not doing well or get worse.  Your e-visit answers were reviewed by a board certified advanced clinical practitioner to complete your personal care plan.  Depending on the condition, your plan could have included both over the counter or prescription medications.  If there is a problem please reply once you have received a response from your provider.  Your safety is important to [place].  If you have drug allergies check your prescription carefully.    You can use MyChart to ask questions about today's visit, request a non-urgent call back, or ask for a work or school excuse for 24 hours related to this e-Visit. If it has been greater than 24 hours you will need to follow up with your provider, or enter a new e-Visit to address those concerns. You will get an e-mail in the next two days asking about your experience.  I hope that your e-visit has been valuable and will speed your recovery. Thank you for using e-visits.   Greater than 5 minutes, yet less than 10 minutes of time have been spent researching, coordinating and implementing care for this patient today.

## 2019-04-04 ENCOUNTER — Other Ambulatory Visit: Payer: Self-pay | Admitting: Cardiovascular Disease

## 2019-04-08 NOTE — Telephone Encounter (Signed)
Rx(s) sent to pharmacy electronically.  

## 2019-04-30 ENCOUNTER — Other Ambulatory Visit: Payer: Self-pay | Admitting: Pharmacist

## 2019-04-30 MED ORDER — VALSARTAN 160 MG PO TABS: ORAL_TABLET | ORAL | 0 refills | Status: DC

## 2019-04-30 MED ORDER — CHLORTHALIDONE 25 MG PO TABS: 25.0000 mg | ORAL_TABLET | Freq: Every day | ORAL | 0 refills | Status: DC

## 2019-05-29 ENCOUNTER — Telehealth: Payer: Self-pay

## 2019-05-29 MED ORDER — CHLORTHALIDONE 25 MG PO TABS: 25.0000 mg | ORAL_TABLET | Freq: Every day | ORAL | 0 refills | Status: DC

## 2019-05-29 NOTE — Telephone Encounter (Signed)
Pt requesting cholrithaliadone will route to pharmd

## 2019-06-03 ENCOUNTER — Other Ambulatory Visit: Payer: Self-pay | Admitting: Cardiovascular Disease

## 2019-06-12 ENCOUNTER — Encounter: Payer: Self-pay | Admitting: Cardiovascular Disease

## 2019-06-12 ENCOUNTER — Other Ambulatory Visit: Payer: Self-pay

## 2019-06-12 ENCOUNTER — Ambulatory Visit: Payer: Commercial Managed Care - PPO | Admitting: Cardiovascular Disease

## 2019-06-12 DIAGNOSIS — G4733 Obstructive sleep apnea (adult) (pediatric): Secondary | ICD-10-CM

## 2019-06-12 DIAGNOSIS — E782 Mixed hyperlipidemia: Secondary | ICD-10-CM

## 2019-06-12 DIAGNOSIS — R072 Precordial pain: Secondary | ICD-10-CM | POA: Diagnosis not present

## 2019-06-12 DIAGNOSIS — Z720 Tobacco use: Secondary | ICD-10-CM

## 2019-06-12 DIAGNOSIS — I10 Essential (primary) hypertension: Secondary | ICD-10-CM | POA: Diagnosis not present

## 2019-06-12 DIAGNOSIS — E785 Hyperlipidemia, unspecified: Secondary | ICD-10-CM | POA: Insufficient documentation

## 2019-06-12 NOTE — Patient Instructions (Signed)
Medication Instructions:  Your physician recommends that you continue on your current medications as directed. Please refer to the Current Medication list given to you today.  If you need a refill on your cardiac medications before your next appointment, please call your pharmacy.   Lab work: NONE  Testing/Procedures: Coronary Calcium Score  Follow-Up: At [facility], you and your health needs are our priority.  As part of our continuing mission to provide you with exceptional heart care, we have created designated Provider Care Teams.  These Care Teams include your primary Cardiologist (physician) and Advanced Practice Providers (APPs -  Physician Assistants and Nurse Practitioners) who all work together to provide you with the care you need, when you need it. You may see Dr Gwenlyn Found or one of the following Advanced Practice Providers on your designated Care Team:    Kerin Ransom, PA-C  [place], [place]  Coletta Memos, [place] Your physician wants you to follow-up in: 1 year   Any Other Special Instructions Will Be Listed Below (If Applicable).   Coronary Calcium Scan A coronary calcium scan is an imaging test used to look for deposits of calcium and other fatty materials (plaques) in the inner lining of the blood vessels of the heart (coronary arteries). These deposits of calcium and plaques can partly clog and narrow the coronary arteries without producing any symptoms or warning signs. This puts a person at risk for a heart attack. This test can detect these deposits before symptoms develop. Tell a health care provider about:  Any allergies you have.  All medicines you are taking, including vitamins, herbs, eye drops, creams, and over-the-counter medicines.  Any problems you or family members have had with anesthetic medicines.  Any blood disorders you have.  Any surgeries you have had.  Any medical conditions you have.  Whether you are pregnant or may be pregnant. What  are the risks? Generally, this is a safe procedure. However, problems may occur, including:  Harm to a pregnant woman and her unborn baby. This test involves the use of radiation. Radiation exposure can be dangerous to a pregnant woman and her unborn baby. If you are pregnant, you generally should not have this procedure done.  Slight increase in the risk of cancer. This is because of the radiation involved in the test. What happens before the procedure? No preparation is needed for this procedure. What happens during the procedure?   You will undress and remove any jewelry around your neck or chest.  You will put on a hospital gown.  Sticky electrodes will be placed on your chest. The electrodes will be connected to an electrocardiogram (ECG) machine to record a tracing of the electrical activity of your heart.  A CT scanner will take pictures of your heart. During this time, you will be asked to lie still and hold your breath for 2-3 seconds while a picture of your heart is being taken. The procedure may vary among health care providers and hospitals. What happens after the procedure?  You can get dressed.  You can return to your normal activities.  It is up to you to get the results of your test. Ask your health care provider, or the department that is doing the test, when your results will be ready. Summary  A coronary calcium scan is an imaging test used to look for deposits of calcium and other fatty materials (plaques) in the inner lining of the blood vessels of the heart (coronary arteries).  Generally, this  is a safe procedure. Tell your health care provider if you are pregnant or may be pregnant.  No preparation is needed for this procedure.  A CT scanner will take pictures of your heart.  You can return to your normal activities after the scan is done. This information is not intended to replace advice given to you by your health care provider. Make sure you discuss  any questions you have with your health care provider. Document Released: 12/17/2007 Document Revised: 06/02/2017 Document Reviewed: 05/09/2016 Elsevier Patient Education  2020 [employer].

## 2019-06-12 NOTE — Assessment & Plan Note (Signed)
Mild hyperlipidemia with an LDL of 124 measured on 08/10/2017.  He does admit to dietary indiscretion.  Go to get a coronary calcium score to risk stratify him determine how aggressive to be with his risk factor modification.

## 2019-06-12 NOTE — Assessment & Plan Note (Signed)
Symptoms consistent with obstructive sleep apnea although he is never going to have a sleep study as ordered.

## 2019-06-12 NOTE — Assessment & Plan Note (Signed)
Continued tobacco use of 3 to 5 cigarettes a day

## 2019-06-12 NOTE — Progress Notes (Signed)
06/12/2019 Nathan Garcia   Feb 25, 1976  [id number]  Primary Physician Sunnie Nielsen, DO Primary Cardiologist: Runell Gess MD FACP, [place], [place], [place]  HPI:  Nathan Garcia is a 43 y.o.  moderately overweight Caucasian male with no children. He works as a Art therapist at [employer]. He was referred by Wyline Copas for coronary vessel evaluation and treatment of his hypertension.I last saw him in the office  11/07/2017.His cardiac risk factors profile is notable for tobacco abuse having smoked one half pack per day for the last 25 years. He is unaware of his lipid profile. He is not diabetic. He does have newly recognized hypertension. There is no family history. He denies chest pain or shortness of breath. He never had a heart attack or stroke. He does admit to dietary indiscretion with regards to salt as well as symptoms compatible with obstructive sleep apnea. Since I saw him several months ago he has filled his prescriptions and is taking them as directed.  He was seeing [name] in the office for antihypertensive medication titration.  Blood pressures have been under better control since he is been taking his medicines as directed.  He was taken off the Bystolic.  He continues to smoke 3 - 5 cigarettes a day.  Since I saw him a year and a half ago he continues to do well.  He denies chest pain or shortness of breath.  He does continue to smoke however.    Current Meds  Medication Sig  . chlorthalidone (HYGROTON) 25 MG tablet TAKE 1 TABLET(25 MG) BY MOUTH DAILY  . valsartan (DIOVAN) 160 MG tablet TAKE 1 AND 1/2 TABLETS BY MOUTH DAILY     Allergies  Allergen Reactions  . Amlodipine     Low extremity edema    Social History   Socioeconomic History  . Marital status: Single    Spouse name: Not on file  . Number of children: Not on file  . Years of education: Not on file  . Highest education level: Not on file  Occupational History  . Not on file   Social Needs  . Financial resource strain: Not on file  . Food insecurity    Worry: Not on file    Inability: Not on file  . Transportation needs    Medical: Not on file    Non-medical: Not on file  Tobacco Use  . Smoking status: Current Every Day Smoker    Packs/day: 0.50    Types: Cigarettes  . Smokeless tobacco: Never Used  Substance and Sexual Activity  . Alcohol use: Yes  . Drug use: No  . Sexual activity: Not on file  Lifestyle  . Physical activity    Days per week: Not on file    Minutes per session: Not on file  . Stress: Not on file  Relationships  . Social Musician on phone: Not on file    Gets together: Not on file    Attends religious service: Not on file    Active member of club or organization: Not on file    Attends meetings of clubs or organizations: Not on file    Relationship status: Not on file  . Intimate partner violence    Fear of current or ex partner: Not on file    Emotionally abused: Not on file    Physically abused: Not on file    Forced sexual activity: Not on file  Other Topics Concern  . Not  on file  Social History Narrative  . Not on file     Review of Systems: General: negative for chills, fever, night sweats or weight changes.  Cardiovascular: negative for chest pain, dyspnea on exertion, edema, orthopnea, palpitations, paroxysmal nocturnal dyspnea or shortness of breath Dermatological: negative for rash Respiratory: negative for cough or wheezing Urologic: negative for hematuria Abdominal: negative for nausea, vomiting, diarrhea, bright red blood per rectum, melena, or hematemesis Neurologic: negative for visual changes, syncope, or dizziness All other systems reviewed and are otherwise negative except as noted above.    Blood pressure 116/80, pulse (!) 108, temperature 97.8 F (36.6 C), height 6\' 2"  (1.88 m), weight 292 lb (132.5 kg).  General appearance: alert and no distress Neck: no adenopathy, no carotid  bruit, no JVD, supple, symmetrical, trachea midline and thyroid not enlarged, symmetric, no tenderness/mass/nodules Lungs: clear to auscultation bilaterally Heart: regular rate and rhythm, S1, S2 normal, no murmur, click, rub or gallop Extremities: extremities normal, atraumatic, no cyanosis or edema Pulses: 2+ and symmetric Skin: Skin color, texture, turgor normal. No rashes or lesions Neurologic: Alert and oriented X 3, normal strength and tone. Normal symmetric reflexes. Normal coordination and gait  EKG sinus tachycardia at 108 with nonspecific ST and T wave changes.  I personally reviewed this EKG.  ASSESSMENT AND PLAN:   Essential hypertension History of essential hypertension with blood pressure measured today 116/80.  He is on valsartan and chlorthalidone.  Blood pressures have been under better control.  He checks them at home.  Obstructive sleep apnea Symptoms consistent with obstructive sleep apnea although he is never going to have a sleep study as ordered.  Tobacco abuse Continued tobacco use of 3 to 5 cigarettes a day  Hyperlipidemia Mild hyperlipidemia with an LDL of 124 measured on 08/10/2017.  He does admit to dietary indiscretion.  Go to get a coronary calcium score to risk stratify him determine how aggressive to be with his risk factor modification.      Lorretta Harp MD FACP,FACC,FAHA, [facility] 06/12/2019 4:51 PM

## 2019-06-12 NOTE — Assessment & Plan Note (Addendum)
History of essential hypertension with blood pressure measured today 116/80.  He is on valsartan and chlorthalidone.  Blood pressures have been under better control.  He checks them at home.

## 2019-07-01 ENCOUNTER — Other Ambulatory Visit: Payer: Self-pay

## 2019-07-01 MED ORDER — CHLORTHALIDONE 25 MG PO TABS: ORAL_TABLET | ORAL | 3 refills | Status: DC

## 2019-07-01 MED ORDER — VALSARTAN 160 MG PO TABS: ORAL_TABLET | ORAL | 3 refills | Status: DC

## 2019-07-03 ENCOUNTER — Other Ambulatory Visit: Payer: Self-pay | Admitting: Cardiovascular Disease

## 2019-07-03 NOTE — Telephone Encounter (Signed)
Rx(s) sent to pharmacy electronically.  

## 2019-07-09 ENCOUNTER — Other Ambulatory Visit: Payer: Self-pay

## 2019-07-09 ENCOUNTER — Ambulatory Visit (INDEPENDENT_AMBULATORY_CARE_PROVIDER_SITE_OTHER)
Admission: RE | Admit: 2019-07-09 | Discharge: 2019-07-09 | Disposition: A | Discharge status: Home / Self Care | Payer: Self-pay | Source: Ambulatory Visit | Attending: Cardiovascular Disease | Admitting: Cardiovascular Disease

## 2019-07-09 DIAGNOSIS — R072 Precordial pain: Secondary | ICD-10-CM

## 2019-08-26 ENCOUNTER — Ambulatory Visit: Payer: Commercial Managed Care - PPO | Attending: Internal Medicine

## 2019-08-26 DIAGNOSIS — Z20822 Contact with and (suspected) exposure to covid-19: Secondary | ICD-10-CM

## 2019-08-27 LAB — NOVEL CORONAVIRUS, NAA: SARS-CoV-2, NAA: DETECTED — AB

## 2019-08-28 ENCOUNTER — Telehealth: Payer: Self-pay | Admitting: Nurse Practitioner

## 2019-08-28 NOTE — Telephone Encounter (Signed)
Called to discuss with Nathan Garcia about Covid symptoms and the use of bamlanivimab, a monoclonal antibody infusion for those with mild to moderate Covid symptoms and at a high risk of hospitalization.     Pt is qualified for this infusion at the [facility] infusion center due to co-morbid conditions (hypertension and BMI >35) and/or a member of an at-risk group.   Per chart review patient's symptoms began on 08/24/19. Last eligible date to receive infusion would be 09/02/19.   Unable to reach patient. Message left and MyChart message sent.   Patient Active Problem List   Diagnosis Date Noted  . Hyperlipidemia 06/12/2019  . Tobacco abuse 11/07/2017  . Ankle fracture, left 08/14/2017  . Obstructive sleep apnea 01/06/2017  . Essential hypertension 12/22/2016  . Abscess of right axilla 12/22/2016    Willette Alma, AGPCNP-BC Pager: [phone] Amion: N. Cousar

## 2019-08-30 ENCOUNTER — Encounter (HOSPITAL_COMMUNITY): Payer: Self-pay

## 2019-08-30 ENCOUNTER — Ambulatory Visit (HOSPITAL_COMMUNITY)
Admission: RE | Admit: 2019-08-30 | Discharge: 2019-08-30 | Disposition: A | Discharge status: Home / Self Care | Payer: Commercial Managed Care - PPO | Source: Ambulatory Visit | Attending: Pulmonary Disease | Admitting: Pulmonary Disease

## 2019-08-30 ENCOUNTER — Telehealth: Payer: Self-pay | Admitting: Nurse Practitioner

## 2019-08-30 ENCOUNTER — Other Ambulatory Visit: Payer: Self-pay | Admitting: Nurse Practitioner

## 2019-08-30 DIAGNOSIS — U071 COVID-19: Secondary | ICD-10-CM

## 2019-08-30 DIAGNOSIS — I10 Essential (primary) hypertension: Secondary | ICD-10-CM | POA: Insufficient documentation

## 2019-08-30 MED ORDER — METHYLPREDNISOLONE SODIUM SUCC 125 MG IJ SOLR: 125.0000 mg | Freq: Once | INTRAMUSCULAR | Status: DC | PRN

## 2019-08-30 MED ORDER — SODIUM CHLORIDE 0.9 % IV SOLN
700.0000 mg | Freq: Once | INTRAVENOUS | Status: AC
  Administered 2019-08-30: 700 mg via INTRAVENOUS
  Filled 2019-08-30 (×2): qty 20

## 2019-08-30 MED ORDER — EPINEPHRINE 0.3 MG/0.3ML IJ SOAJ: 0.3000 mg | Freq: Once | INTRAMUSCULAR | Status: DC | PRN

## 2019-08-30 MED ORDER — SODIUM CHLORIDE 0.9 % IV SOLN
INTRAVENOUS | Status: DC | PRN
  Administered 2019-08-30: 250 mL via INTRAVENOUS

## 2019-08-30 MED ORDER — DIPHENHYDRAMINE HCL 50 MG/ML IJ SOLN: 50.0000 mg | Freq: Once | INTRAMUSCULAR | Status: DC | PRN

## 2019-08-30 MED ORDER — FAMOTIDINE IN NACL 20-0.9 MG/50ML-% IV SOLN: 20.0000 mg | Freq: Once | INTRAVENOUS | Status: DC | PRN

## 2019-08-30 MED ORDER — ALBUTEROL SULFATE HFA 108 (90 BASE) MCG/ACT IN AERS: 2.0000 | INHALATION_SPRAY | Freq: Once | RESPIRATORY_TRACT | Status: DC | PRN

## 2019-08-30 NOTE — Progress Notes (Signed)
  I connected by phone with Pearletha Forge on 08/30/2019 at 10:35 AM to discuss the potential use of an new treatment for mild to moderate COVID-19 viral infection in non-hospitalized patients.  This patient is a 44 y.o. male that meets the FDA criteria for Emergency Use Authorization of bamlanivimab or casirivimab\imdevimab.  Has a (+) direct SARS-CoV-2 viral test result  Has mild or moderate COVID-19   Is ? 43 years of age and weighs ? 40 kg  Is NOT hospitalized due to COVID-19  Is NOT requiring oxygen therapy or requiring an increase in baseline oxygen flow rate due to COVID-19  Is within 10 days of symptom onset  Has at least one of the high risk factor(s) for progression to severe COVID-19 and/or hospitalization as defined in EUA.  Specific high risk criteria : Hypertension and BMI >35.    I have spoken and communicated the following to the patient or parent/caregiver:  1. FDA has authorized the emergency use of bamlanivimab and casirivimab\imdevimab for the treatment of mild to moderate COVID-19 in adults and pediatric patients with positive results of direct SARS-CoV-2 viral testing who are 22 years of age and older weighing at least 40 kg, and who are at high risk for progressing to severe COVID-19 and/or hospitalization.  2. The significant known and potential risks and benefits of bamlanivimab and casirivimab\imdevimab, and the extent to which such potential risks and benefits are unknown.  3. Information on available alternative treatments and the risks and benefits of those alternatives, including clinical trials.  4. Patients treated with bamlanivimab and casirivimab\imdevimab should continue to self-isolate and use infection control measures (e.g., wear mask, isolate, social distance, avoid sharing personal items, clean and disinfect "high touch" surfaces, and frequent handwashing) according to CDC guidelines.   5. The patient or parent/caregiver has the option to accept  or refuse bamlanivimab or casirivimab\imdevimab .  After reviewing this information with the patient, The patient agreed to proceed with receiving the bamlanimivab infusion and will be provided a copy of the Fact sheet prior to receiving the infusion.   Nikki Pickenpack-Cousar 08/30/2019 10:35 AM

## 2019-08-30 NOTE — Telephone Encounter (Signed)
Called to discuss with Elam Farrel about Covid symptoms and the use of bamlanivimab, a monoclonal antibody infusion for those with mild to moderate Covid symptoms and at a high risk of hospitalization.     Pt is qualified for this infusion at the [facility] infusion center due to co-morbid conditions and/or a member of an at-risk group.   Patient scheduled for 08/30/19 at 1430 as requested. He verbalized understanding of treatment and appointment details. MyChart confirmation also sent.    Patient Active Problem List   Diagnosis Date Noted  . Hyperlipidemia 06/12/2019  . Tobacco abuse 11/07/2017  . Ankle fracture, left 08/14/2017  . Obstructive sleep apnea 01/06/2017  . Essential hypertension 12/22/2016  . Abscess of right axilla 12/22/2016    Willette Alma, AGPCNP-BC Pager: [phone] Amion: N. Cousar

## 2019-08-30 NOTE — Progress Notes (Signed)
  Diagnosis: COVID-19  Physician:Dr Wrght  Procedure: Covid Infusion Clinic Med: bamlanivimab infusion - Provided patient with bamlanimivab fact sheet for patients, parents and caregivers prior to infusion.  Complications: No immediate complications noted.  Discharge: Discharged home   Nathan Garcia 08/30/2019

## 2019-08-30 NOTE — Discharge Instructions (Signed)

## 2019-09-09 ENCOUNTER — Ambulatory Visit: Payer: Commercial Managed Care - PPO | Attending: Internal Medicine

## 2019-09-09 DIAGNOSIS — Z20822 Contact with and (suspected) exposure to covid-19: Secondary | ICD-10-CM

## 2019-09-10 LAB — NOVEL CORONAVIRUS, NAA: SARS-CoV-2, NAA: NOT DETECTED

## 2019-10-16 ENCOUNTER — Encounter: Payer: Self-pay | Admitting: Osteopathic Medicine

## 2019-10-16 ENCOUNTER — Ambulatory Visit (INDEPENDENT_AMBULATORY_CARE_PROVIDER_SITE_OTHER): Payer: Commercial Managed Care - PPO | Admitting: Osteopathic Medicine

## 2019-10-16 ENCOUNTER — Other Ambulatory Visit: Payer: Self-pay

## 2019-10-16 VITALS — BP 125/86 | HR 101 | Wt 293.0 lb

## 2019-10-16 DIAGNOSIS — R0789 Other chest pain: Secondary | ICD-10-CM | POA: Diagnosis not present

## 2019-10-16 LAB — D-DIMER, QUANTITATIVE: D-Dimer, Quant: 0.27 mcg/mL FEU (ref <0.50)

## 2019-10-16 MED ORDER — NAPROXEN 500 MG PO TABS: 500.0000 mg | ORAL_TABLET | Freq: Two times a day (BID) | ORAL | 3 refills | Status: DC

## 2019-10-16 NOTE — Patient Instructions (Signed)
Costochondritis  Costochondritis is swelling and irritation (inflammation) of the tissue (cartilage) that connects your ribs to your breastbone (sternum). This causes pain in the front of your chest. The pain usually starts gradually and involves more than one rib. What are the causes? The exact cause of this condition is not always known. It results from stress on the cartilage where your ribs attach to your sternum. The cause of this stress could be:  Chest injury (trauma).  Exercise or activity, such as lifting.  Severe coughing. What increases the risk? You may be at higher risk for this condition if you:  Are male.  Are 30?43 years old.  Recently started a new exercise or work activity.  Have low levels of vitamin D.  Have a condition that makes you cough frequently. What are the signs or symptoms? The main symptom of this condition is chest pain. The pain:  Usually starts gradually and can be sharp or dull.  Gets worse with deep breathing, coughing, or exercise.  Gets better with rest.  May be worse when you press on the sternum-rib connection (tenderness). How is this diagnosed? This condition is diagnosed based on your symptoms, medical history, and a physical exam. Your health care provider will check for tenderness when pressing on your sternum. This is the most important finding. You may also have tests to rule out other causes of chest pain. These may include:  A chest X-ray to check for lung problems.  An electrocardiogram (ECG) to see if you have a heart problem that could be causing the pain.  An imaging scan to rule out a chest or rib fracture. How is this treated? This condition usually goes away on its own over time. Your health care provider may prescribe an NSAID to reduce pain and inflammation. Your health care provider may also suggest that you:  Rest and avoid activities that make pain worse.  Apply heat or cold to the area to reduce pain and  inflammation.  Do exercises to stretch your chest muscles. If these treatments do not help, your health care provider may inject a numbing medicine at the sternum-rib connection to help relieve the pain. Follow these instructions at home:  Avoid activities that make pain worse. This includes any activities that use chest, abdominal, and side muscles.  If directed, put ice on the painful area: ? Put ice in a plastic bag. ? Place a towel between your skin and the bag. ? Leave the ice on for 20 minutes, 2-3 times a day.  If directed, apply heat to the affected area as often as told by your health care provider. Use the heat source that your health care provider recommends, such as a moist heat pack or a heating pad. ? Place a towel between your skin and the heat source. ? Leave the heat on for 20-30 minutes. ? Remove the heat if your skin turns bright red. This is especially important if you are unable to feel pain, heat, or cold. You may have a greater risk of getting burned.  Take over-the-counter and prescription medicines only as told by your health care provider.  Return to your normal activities as told by your health care provider. Ask your health care provider what activities are safe for you.  Keep all follow-up visits as told by your health care provider. This is important. Contact a health care provider if:  You have chills or a fever.  Your pain does not go away or it gets   worse.  You have a cough that does not go away (is persistent). Get help right away if:  You have shortness of breath. This information is not intended to replace advice given to you by your health care provider. Make sure you discuss any questions you have with your health care provider. Document Revised: 07/05/2017 Document Reviewed: 10/14/2015 Elsevier Patient Education  2020 Else[employer] Inc[employer]    Nonspecific Chest Pain, Adult Get help right away if:  Your chest pain gets worse.  You have a cough  that gets worse, or you cough up blood.  You have severe pain in your abdomen.  You faint.  You have sudden, unexplained chest discomfort.  You have sudden, unexplained discomfort in your arms, back, neck, or jaw.  You have shortness of breath at any time.  You suddenly start to sweat, or your skin gets clammy.  You feel nausea or you vomit.  You suddenly feel lightheaded or dizzy.  You have severe weakness, or unexplained weakness or fatigue.  Your heart begins to beat quickly, or it feels like it is skipping beats. These symptoms may represent a serious problem that is an emergency. Do not wait to see if the symptoms will go away. Get medical help right away. Call your local emergency services (911 in the [place] Do not drive yourself to the hospital. Summary  Chest pain can be caused by a condition that is serious and requires urgent treatment. It may also be caused by something that is not life-threatening.  If you have chest pain, it is very important to see your health care provider. Your health care provider may do lab tests and other studies to find the cause of your pain.  Follow your health care provider's instructions on taking medicines, making lifestyle changes, and getting emergency treatment if symptoms become worse.  Keep all follow-up visits as told by your health care provider. This includes visits for any further testing if your chest pain does not go away. This information is not intended to replace advice given to you by your health care provider. Make sure you discuss any questions you have with your health care provider. Document Revised: 12/21/2017 Document Reviewed: 12/21/2017 Elsevier Patient Education  2020 [employer].

## 2019-10-16 NOTE — Progress Notes (Signed)
Nathan Garcia is a 44 y.o. male who presents to  [facility] at [facility]  today, 10/16/19, seeking care for the following:  . Dull chest pain  o x1 week, central/L chest, new onset o Last saw cardiology 06/2019 (Dr Allyson Sabal) notes reviewed. At that time, adherent to Rx, BP looking ok, was advised d/c tobacco, was advised sleep study but declined.  o Cardiac calcium score obtained 07/09/2019 - no evidence of CAD.  o Possibly pertinent: (+)COVID Dx 08/2020 o NO SOB, HA.VC, DOE. Brief episode yesterday of lightheadedness.         ASSESSMENT & PLAN with other pertinent history/findings:  The encounter diagnosis was Other chest pain.   Seems most likely costochondritis, no alarm signs/symptoms but given relatively recent COVID hx and some dizziness yesterday, I think would be wise to obtain D-dimer and proceed w/ CTA if (+). ER precuations reviewed if worse. NSAID Rx sent for presumed costochondritis.     EKG interpretation: Rate: 97 Rhythm: sinus No ST/T changes concerning for acute ischemia/infarct  Previous EKG from cardiology 06/12/2019 - no major changes except III looks like nonspecific ?Q vs downward deflection / borderline axis   BP under good control today BP Readings from Last 3 Encounters:  10/16/19 125/86  08/30/19 127/84  06/12/19 116/80    Pain localizes to costochondral juncture anterior L chest, not quite tender but pt notes it is sort of reproduced w/ palpation of that area, nothing on contralateral side. H: RRR, S1S2 WNL; L: CTABL   Orders Placed This Encounter  Procedures  . D-dimer, quantitative (not at [facility])    Meds ordered this encounter  Medications  . naproxen (NAPROSYN) 500 MG tablet    Sig: Take 1 tablet (500 mg total) by mouth 2 (two) times daily with a meal.    Dispense:  60 tablet    Refill:  3       Follow-up instructions: Return for RECHECK PENDING RESULTS / IF WORSE OR  CHANGE.                                         BP 125/86 (BP Location: Left Arm, Patient Position: Sitting, Cuff Size: Large)   Pulse (!) 101   Wt 293 lb (132.9 kg)   SpO2 96%   BMI 37.62 kg/m   Current Meds  Medication Sig  . chlorthalidone (HYGROTON) 25 MG tablet TAKE 1 TABLET(25 MG) BY MOUTH DAILY  . valsartan (DIOVAN) 160 MG tablet Take 1.5 tablets (240 mg total) by mouth daily.    No results found for this or any previous visit (from the past 72 hour(s)).   No flowsheet data found.    All questions at time of visit were answered - patient instructed to contact office with any additional concerns or updates.  ER/RTC precautions were reviewed with the patient.  Please note: voice recognition software was used to produce this document, and typos may escape review. Please contact Dr. Lyn Hollingshead for any needed clarifications.

## 2019-10-16 NOTE — Addendum Note (Signed)
Addended by: Ardyth Man on: 10/16/2019 04:54 PM   Modules accepted: Orders

## 2020-05-04 ENCOUNTER — Other Ambulatory Visit: Payer: Self-pay

## 2020-05-04 MED ORDER — CHLORTHALIDONE 25 MG PO TABS: ORAL_TABLET | ORAL | 3 refills | Status: DC

## 2020-05-04 MED ORDER — VALSARTAN 160 MG PO TABS: 240.0000 mg | ORAL_TABLET | Freq: Every day | ORAL | 3 refills | Status: DC

## 2020-05-05 ENCOUNTER — Other Ambulatory Visit: Payer: Self-pay | Admitting: Osteopathic Medicine

## 2020-06-04 ENCOUNTER — Other Ambulatory Visit: Payer: Self-pay | Admitting: Osteopathic Medicine

## 2020-06-09 ENCOUNTER — Ambulatory Visit: Payer: Self-pay | Attending: Internal Medicine

## 2020-06-09 DIAGNOSIS — Z23 Encounter for immunization: Secondary | ICD-10-CM

## 2020-06-09 NOTE — Progress Notes (Signed)
   Covid-19 Vaccination Clinic  Name:  Nathan Garcia    MRN: [id number] DOB: 05-17-1976  06/09/2020  Nathan Garcia was observed post Covid-19 immunization for 15 minutes without incident. He was provided with Vaccine Information Sheet and instruction to access the V-Safe system.   Nathan Garcia was instructed to call 911 with any severe reactions post vaccine: Marland Kitchen Difficulty breathing  . Swelling of face and throat  . A fast heartbeat  . A bad rash all over body  . Dizziness and weakness   Immunizations Administered    Name Date Dose VIS Date Route   Pfizer COVID-19 Vaccine 06/09/2020  4:05 PM 0.3 mL 04/22/2020 Intramuscular   Manufacturer: [employer], [employer]   Lot: [id number]   NDC: [address]-6

## 2020-07-04 ENCOUNTER — Other Ambulatory Visit: Payer: Self-pay | Admitting: Osteopathic Medicine

## 2020-07-08 ENCOUNTER — Other Ambulatory Visit: Payer: Self-pay | Admitting: *Deleted

## 2020-07-08 MED ORDER — NAPROXEN 500 MG PO TABS: ORAL_TABLET | ORAL | 0 refills | Status: DC

## 2020-07-14 ENCOUNTER — Ambulatory Visit: Payer: Commercial Managed Care - PPO | Admitting: Osteopathic Medicine

## 2020-07-14 ENCOUNTER — Telehealth: Payer: Self-pay | Admitting: Osteopathic Medicine

## 2020-07-14 NOTE — Telephone Encounter (Signed)
Pt lvm at 8:06 to cancel his 7:50 appt.

## 2020-07-24 ENCOUNTER — Other Ambulatory Visit: Payer: Self-pay

## 2020-07-24 ENCOUNTER — Ambulatory Visit: Payer: Commercial Managed Care - PPO | Admitting: Cardiovascular Disease

## 2020-07-24 ENCOUNTER — Encounter: Payer: Self-pay | Admitting: Cardiovascular Disease

## 2020-07-24 VITALS — BP 138/88 | HR 97 | Ht 74.0 in | Wt 306.4 lb

## 2020-07-24 DIAGNOSIS — R400 Somnolence: Secondary | ICD-10-CM

## 2020-07-24 DIAGNOSIS — I10 Essential (primary) hypertension: Secondary | ICD-10-CM

## 2020-07-24 DIAGNOSIS — E785 Hyperlipidemia, unspecified: Secondary | ICD-10-CM

## 2020-07-24 DIAGNOSIS — Z720 Tobacco use: Secondary | ICD-10-CM

## 2020-07-24 DIAGNOSIS — R0683 Snoring: Secondary | ICD-10-CM | POA: Diagnosis not present

## 2020-07-24 LAB — HEPATIC FUNCTION PANEL
ALT: 36 IU/L (ref 0–44)
AST: 22 IU/L (ref 0–40)
Albumin: 4.6 g/dL (ref 4.0–5.0)
Alkaline Phosphatase: 48 IU/L (ref 44–121)
Bilirubin Total: 0.6 mg/dL (ref 0.0–1.2)
Bilirubin, Direct: 0.14 mg/dL (ref 0.00–0.40)
Total Protein: 7.2 g/dL (ref 6.0–8.5)

## 2020-07-24 LAB — LIPID PANEL
Chol/HDL Ratio: 4.1 ratio (ref 0.0–5.0)
Cholesterol, Total: 199 mg/dL (ref 100–199)
HDL: 48 mg/dL (ref >39)
LDL Chol Calc (NIH): 120 mg/dL — ABNORMAL HIGH (ref 0–99)
Triglycerides: 177 mg/dL — ABNORMAL HIGH (ref 0–149)
VLDL Cholesterol Cal: 31 mg/dL (ref 5–40)

## 2020-07-24 NOTE — Assessment & Plan Note (Signed)
History of hyperlipidemia not on statin therapy.  We will recheck a lipid liver profile this morning.

## 2020-07-24 NOTE — Progress Notes (Signed)
07/24/2020 Nathan Garcia   Oct 20, 1975  [id number]  Primary Physician Sunnie Nielsen, DO Primary Cardiologist: Runell Gess MD FACP, [place], [place], [place]  HPI:  Nathan Garcia is a 45 y.o.  moderately overweight Caucasian male with no children. He works as a Art therapist at [employer]. He was referred by Wyline Copas for coronary vessel evaluation and treatment of his hypertension.I last saw him in the office 06/12/2019.His cardiac risk factors profile is notable for tobacco abuse having smoked one half pack per day for the last 25 years. He is unaware of his lipid profile. He is not diabetic. He does have newly recognized hypertension. There is no family history. He denies chest pain or shortness of breath. He never had a heart attack or stroke. He does admit to dietary indiscretion with regards to salt as well as symptoms compatible with obstructive sleep apnea. Since I saw him several months ago he has filled his prescriptions and is taking them as directed. He was seeing [name] in the office for antihypertensive medication titration. Blood pressures have been under better control since he is been taking his medicines as directed. He was taken off the Bystolic. He continues to smoke 3 - 5 cigarettes a day.  Since I saw him a year ago he continues to do well.  He denies chest pain or shortness of breath.  He does continue to smoke however.  He has also gained 13 pounds and admits to lack of exercise.  He also drinks 1/5 of vodka 6 days a week and is seeking help for this.   Current Meds  Medication Sig  . chlorthalidone (HYGROTON) 25 MG tablet TAKE 1 TABLET(25 MG) BY MOUTH DAILY  . naproxen (NAPROSYN) 500 MG tablet TAKE 1 TABLET(500 MG) BY MOUTH TWICE DAILY WITH A MEAL  . valsartan (DIOVAN) 160 MG tablet Take 1.5 tablets (240 mg total) by mouth daily.     Allergies  Allergen Reactions  . Amlodipine     Low extremity edema    Social History    Socioeconomic History  . Marital status: Single    Spouse name: Not on file  . Number of children: Not on file  . Years of education: Not on file  . Highest education level: Not on file  Occupational History  . Not on file  Tobacco Use  . Smoking status: Current Every Day Smoker    Packs/day: 0.50    Types: Cigarettes  . Smokeless tobacco: Never Used  Substance and Sexual Activity  . Alcohol use: Yes  . Drug use: No  . Sexual activity: Not on file  Other Topics Concern  . Not on file  Social History Narrative  . Not on file   Social Determinants of Health   Financial Resource Strain: Not on file  Food Insecurity: Not on file  Transportation Needs: Not on file  Physical Activity: Not on file  Stress: Not on file  Social Connections: Not on file  Intimate Partner Violence: Not on file     Review of Systems: General: negative for chills, fever, night sweats or weight changes.  Cardiovascular: negative for chest pain, dyspnea on exertion, edema, orthopnea, palpitations, paroxysmal nocturnal dyspnea or shortness of breath Dermatological: negative for rash Respiratory: negative for cough or wheezing Urologic: negative for hematuria Abdominal: negative for nausea, vomiting, diarrhea, bright red blood per rectum, melena, or hematemesis Neurologic: negative for visual changes, syncope, or dizziness All other systems reviewed and are otherwise negative except as  noted above.    Blood pressure 138/88, pulse 97, height 6\' 2"  (1.88 m), weight (!) 306 lb 6.4 oz (139 kg).  General appearance: alert and no distress Neck: no adenopathy, no carotid bruit, no JVD, supple, symmetrical, trachea midline and thyroid not enlarged, symmetric, no tenderness/mass/nodules Lungs: clear to auscultation bilaterally Heart: regular rate and rhythm, S1, S2 normal, no murmur, click, rub or gallop Extremities: extremities normal, atraumatic, no cyanosis or edema Pulses: 2+ and symmetric Skin:  Skin color, texture, turgor normal. No rashes or lesions Neurologic: Alert and oriented X 3, normal strength and tone. Normal symmetric reflexes. Normal coordination and gait  EKG sinus rhythm at 97 with nonspecific ST and T wave changes.  I personally reviewed this EKG.  ASSESSMENT AND PLAN:   Essential hypertension History of essential hypertension blood pressure measured today at 138/88.  He is on valsartan and chlorthalidone.  Obstructive sleep apnea Symptoms of nocturnal snoring and daytime somnolence with body habitus consistent with obstructive sleep apnea.  We will schedule a outpatient sleep study.  Tobacco abuse Continue tobacco abuse of 6 to 10 cigarettes a day, increased from 3 to 5 cigarettes a day a year ago.  We will arrange for him to see our life coach pursue smoking cessation lifestyle management.  Hyperlipidemia History of hyperlipidemia not on statin therapy.  We will recheck a lipid liver profile this morning.      MD FACP,FACC,FAHA, [facility] 07/24/2020 8:09 AM

## 2020-07-24 NOTE — Assessment & Plan Note (Signed)
Continue tobacco abuse of 6 to 10 cigarettes a day, increased from 3 to 5 cigarettes a day a year ago.  We will arrange for him to see our life coach pursue smoking cessation lifestyle management.

## 2020-07-24 NOTE — Patient Instructions (Addendum)
Medication Instructions:  No Changes In Medications at this time.  *If you need a refill on your cardiac medications before your next appointment, please call your pharmacy*  Lab Work: LIPID AND LIVER BLOOD WORK TODAY  If you have labs (blood work) drawn today and your tests are completely normal, you will receive your results only by: Marland Kitchen MyChart Message (if you have MyChart) OR . A paper copy in the mail If you have any lab test that is abnormal or we need to change your treatment, we will call you to review the results.  Follow-Up: At [facility], you and your health needs are our priority.  As part of our continuing mission to provide you with exceptional heart care, we have created designated Provider Care Teams.  These Care Teams include your primary Cardiologist (physician) and Advanced Practice Providers (APPs -  Physician Assistants and Nurse Practitioners) who all work together to provide you with the care you need, when you need it.  Your next appointment:   1 year(s)  The format for your next appointment:   In Person  Provider:   Nanetta Batty, MD  Other Instructions THE CARE GUIDE WILL REACH OUT TO YOU ABOUT DIET AND EXERCISE INFORMATION.  Dr. Allyson Sabal would like for you to have a sleep study.  Your physician has recommended that you have a sleep study. This test records several body functions during sleep, including: brain activity, eye movement, oxygen and carbon dioxide blood levels, heart rate and rhythm, breathing rate and rhythm, the flow of air through your mouth and nose, snoring, body muscle movements, and chest and belly movement. SOMEONE WILL REACH OUT TO YOU TO SCHEDULE THIS.

## 2020-07-24 NOTE — Assessment & Plan Note (Signed)
History of essential hypertension blood pressure measured today at 138/88.  He is on valsartan and chlorthalidone.

## 2020-07-24 NOTE — Assessment & Plan Note (Signed)
Symptoms of nocturnal snoring and daytime somnolence with body habitus consistent with obstructive sleep apnea.  We will schedule a outpatient sleep study.

## 2020-07-30 ENCOUNTER — Ambulatory Visit: Payer: Commercial Managed Care - PPO | Admitting: Osteopathic Medicine

## 2020-08-03 ENCOUNTER — Other Ambulatory Visit: Payer: Self-pay | Admitting: Osteopathic Medicine

## 2020-08-04 ENCOUNTER — Encounter: Payer: Self-pay | Admitting: Cardiovascular Disease

## 2020-08-04 ENCOUNTER — Ambulatory Visit: Payer: Commercial Managed Care - PPO | Admitting: Osteopathic Medicine

## 2020-08-05 ENCOUNTER — Telehealth: Payer: Self-pay

## 2020-08-05 DIAGNOSIS — Z0000 Encounter for general adult medical examination without abnormal findings: Secondary | ICD-10-CM

## 2020-08-05 DIAGNOSIS — E785 Hyperlipidemia, unspecified: Secondary | ICD-10-CM

## 2020-08-05 MED ORDER — ATORVASTATIN CALCIUM 10 MG PO TABS: 5.0000 mg | ORAL_TABLET | Freq: Every day | ORAL | 3 refills | Status: DC

## 2020-08-05 NOTE — Telephone Encounter (Signed)
-----   Message from Runell Gess, MD sent at 07/26/2020 11:24 AM EST ----- Given LDL of 120 (not at goal for primary prevention) rec starting Atorva 5 mg/day and rechecking FLP 2 months

## 2020-08-05 NOTE — Telephone Encounter (Signed)
Pt called back. Told pt the results of cholesterol panel and that Dr. Allyson Sabal would like to start atorvastatin 5mg  daily for the pt.  Pt in agreement to start medication.  Advised pt that we would do lab work in 3 months to follow up on cholesterol levels.  Pt verbalizes understanding.  Prescription sent to pharmacy. Lab orders placed and mailed to pt.

## 2020-08-05 NOTE — Telephone Encounter (Signed)
-----   Message from Jonathan J Berry, MD sent at 07/26/2020 11:24 AM EST ----- Given LDL of 120 (not at goal for primary prevention) rec starting Atorva 5 mg/day and rechecking FLP 2 months 

## 2020-08-05 NOTE — Telephone Encounter (Signed)
Called patient to discuss health coaching for healthy eating and physical activity behaviors per Dr. Allyson Sabal. Left patient a message to return call to Care Guide at [phone].

## 2020-08-13 ENCOUNTER — Other Ambulatory Visit: Payer: Self-pay

## 2020-08-13 ENCOUNTER — Ambulatory Visit (INDEPENDENT_AMBULATORY_CARE_PROVIDER_SITE_OTHER): Payer: Commercial Managed Care - PPO | Admitting: Osteopathic Medicine

## 2020-08-13 ENCOUNTER — Encounter: Payer: Self-pay | Admitting: Osteopathic Medicine

## 2020-08-13 VITALS — BP 138/83 | HR 86 | Temp 98.3°F | Wt 304.0 lb

## 2020-08-13 DIAGNOSIS — Z720 Tobacco use: Secondary | ICD-10-CM

## 2020-08-13 DIAGNOSIS — I10 Essential (primary) hypertension: Secondary | ICD-10-CM | POA: Diagnosis not present

## 2020-08-13 DIAGNOSIS — G4733 Obstructive sleep apnea (adult) (pediatric): Secondary | ICD-10-CM | POA: Diagnosis not present

## 2020-08-13 DIAGNOSIS — L989 Disorder of the skin and subcutaneous tissue, unspecified: Secondary | ICD-10-CM

## 2020-08-13 DIAGNOSIS — E782 Mixed hyperlipidemia: Secondary | ICD-10-CM

## 2020-08-13 DIAGNOSIS — Z789 Other specified health status: Secondary | ICD-10-CM

## 2020-08-13 DIAGNOSIS — Z7289 Other problems related to lifestyle: Secondary | ICD-10-CM

## 2020-08-13 MED ORDER — DIAZEPAM 5 MG PO TABS: ORAL_TABLET | ORAL | 0 refills | Status: DC

## 2020-08-13 MED ORDER — NALTREXONE HCL 50 MG PO TABS: 50.0000 mg | ORAL_TABLET | Freq: Every day | ORAL | 0 refills | Status: DC

## 2020-08-13 NOTE — Progress Notes (Signed)
Nathan Garcia is a 45 y.o. male who presents to  [facility]  today, 08/13/20, seeking care for the following:  . Requests labs: check for diabetes, check cholesterol  o Lipids reviewed - cardiology checked this 07/24/20, started atorvastatin and rechecking lipids in  2 mos  . Requests referral to dermatology: few skin tags that he's like removed, prefers to see Derm  . EtOH - drinking fifth of vodka most nights. Has tried to quit before and stays off alcohol for maybe 3 days then starts again. Interested in receiving help for this. No history of DT/seizure.      ASSESSMENT & PLAN with other pertinent findings:  The primary encounter diagnosis was Essential hypertension. Diagnoses of Mixed hyperlipidemia, Obstructive sleep apnea, Tobacco abuse, Alcohol use, and Skin abnormality were also pertinent to this visit.   Reviewed lipid results from caridology - will recheck labs in another month and cc to Dr Allyson SabalBerry   See pt instructions - we discussed appropriate use of valium and when to seek emergency care. Will follow closely.    Patient Instructions    Will get labs 09/01/20 or around that time   Rx sent for Naltrexone to curb alcohols cravings  Rx sent for Valium if needed for withdrawal management/prevention   See below for reasons to seek emergency care!   Get back to be with name of a therapist! (MyChart will remind yo on Monday!)      Alcohol Withdrawal Syndrome Alcohol withdrawal syndrome is a group of symptoms that can develop when a person who drinks heavily and regularly stops drinking or drinks less. Alcohol withdrawal syndrome can be mild or severe, and it may even be life-threatening. Alcohol withdrawal syndrome usually affects people who have alcohol use disorder, which may also be called alcoholism. Alcohol use disorder is when a person is unable to control his or her alcohol use, and drinking too much  or too often causes problems at home, at work, or in relationships. What are the causes? Drinking heavily and drinking on a regular basis cause changes in brain chemistry. Over time, the body becomes dependent on alcohol. When alcohol use stops, the chemistry system in the brain becomes unbalanced and causes the symptoms of alcohol withdrawal. What increases the risk? Alcohol withdrawal syndrome is more likely to occur in people who drink more than the recommended limit of alcohol (2 drinks a day for men or 1 drink a day for non-pregnant women). It is also more likely to affect heavy drinkers who have been using alcohol for long periods of time. The more a person drinks and the longer he or she drinks, the greater the risk of alcohol withdrawal syndrome. Severe withdrawal is more likely to develop in someone who:  Had severe alcohol withdrawal in the past.  Had a seizure during a previous episode of alcohol withdrawal.  Is elderly.  Uses other drugs.  Has a long-term (chronic) medical problem, such as heart, lung, or liver disease.  Has depression.  Does not get enough nutrients from his or her diet (malnutrition). What are the signs or symptoms? Symptoms of this condition can be mild to moderate, or they can be severe. Symptoms may develop a few hours (or up to a day) after a person changes his or her drinking patterns. During the 48 hours after he or she has stopped drinking, the following symptoms may go away or get better:  Uncontrollable shaking (tremor).  Sweating.  Headache.  Anxiety.  Inability to relax (agitation).  Trouble sleeping (insomnia).  Irregular heartbeats (palpitations).  Alcohol cravings.  Seizure. The following symptoms may get worse 24-48 hours after a person has decreased or stopped alcohol use, and they may gradually improve over a period of days or weeks:  Nausea and vomiting.  Fatigue.  Sensitivity to light and sounds.  Confusion and  inability to think clearly.  Loss of appetite.  Mood swings, irritability, depression, and anxiety.  Insomnia and nightmares. The following symptoms are severe and life-threatening. When these symptoms occur together, they are called delirium tremens (DTs):  High blood pressure.  Increased heart rate.  Trouble breathing.  Seizures. These may go away along with other symptoms, or they may persist.  Seeing, hearing, feeling, smelling, or tasting things that are not there (hallucinations). If you experience hallucinations, they usually begin 12-24 hours after a change in drinking patterns. Delirium tremens requires immediate hospitalization. How is this diagnosed? This condition may be diagnosed based on:  Your symptoms and medical history.  Your history of alcohol use. Your health care provider may ask questions about your drinking behavior. It is important to be honest when you answer these questions.  A psychological assessment.  A physical exam.  Blood tests or urine tests to measure blood alcohol level and to rule out other causes of symptoms.  MRI or CT scan. This may be done if you seem to have abnormal thinking or behaviors (altered mental status). Diagnosis can be difficult. People going through withdrawal often avoid seeking medical care and are not thinking clearly. Friends and family members play an important role in recognizing symptoms and encouraging loved ones to get treatment. How is this treated? Most people with symptoms of withdrawal can be treated outside of a hospital setting (outpatient treatment), with close monitoring such as daily check-ins with a health care provider and counseling. You may need treatment at a hospital or treatment center (inpatient treatment) if:  You have a history of delirium tremens or seizures.  You have severe symptoms.  You are addicted to other drugs.  You cannot swallow medicine.  You have a serious medical condition such  as heart failure.  You experienced withdrawal in the past but then you continued drinking alcohol.  You are not likely to commit to an outpatient treatment schedule. Treatment may involve:  Monitoring your blood pressure, pulse, and breathing.  IV fluids to keep you hydrated.  Medicines to reduce withdrawal symptoms and discomfort (benzodiazepines).  Medicine to reduce anxiety.  Medicine to prevent or control seizures.  Multivitamins and B vitamins.  Having a health care provider check on you daily. It is important to get treatment for alcohol withdrawal early. Getting treatment early can:  Speed up your recovery from withdrawal symptoms.  Make you more successful with long-term stoppage of alcohol use (sobriety). If you need help to stop drinking, your health care provider may recommend a long-term treatment plan that includes:  Medicines to help treat alcohol use disorder.  Substance abuse counseling.  Support groups. Follow these instructions at home:  Take over-the-counter and prescription medicines (including vitamin supplements) only as told by your health care provider.  Do not drink alcohol.  Do not drive until your health care provider approves.  Have someone you trust stay with you or be available if you need help with your symptoms or with not drinking.  Drink enough fluid to keep your urine pale yellow.  Consider joining an alcohol support group or  treatment program. These can provide emotional support, advice, and guidance.  Keep all follow-up visits as told by your health care provider. This is important.   Contact a health care provider if:  Your symptoms get worse instead of better.  You cannot eat or drink without vomiting.  You are struggling with not drinking alcohol.  You cannot stop drinking alcohol. Get help right away if:  You have an irregular heartbeat.  You have chest pain.  You have trouble breathing.  You have a seizure for  the first time.  You hallucinate.  You become very confused. Summary  Alcohol withdrawal is a group of symptoms that can develop when a person who drinks heavily and regularly stops drinking or drinks less.  Symptoms of this condition can be mild to moderate, or they can be severe.  Treatment may include hospitalization, medicine, and counseling. This information is not intended to replace advice given to you by your health care provider. Make sure you discuss any questions you have with your health care provider. Document Revised: 06/02/2017 Document Reviewed: 02/24/2017 Elsevier Patient Education  2021 Elsevier Inc.    Orders Placed This Encounter  Procedures  . CBC  . COMPLETE METABOLIC PANEL WITH GFR  . Lipid panel  . Hemoglobin A1c  . Ambulatory referral to Dermatology    Meds ordered this encounter  Medications  . naltrexone (DEPADE) 50 MG tablet    Sig: Take 1 tablet (50 mg total) by mouth daily.    Dispense:  90 tablet    Refill:  0  . diazepam (VALIUM) 5 MG tablet    Sig: Take 10 mg (2 tablets) po every 6 to 12 hours on day 1 quitting drinking if tremors or anxiety; 5 mg po q 8 hours for the next 3 to 5 days; 2.5 mg (half tablet) po bid prn    Dispense:  30 tablet    Refill:  0     See below for relevant physical exam findings  See below for recent lab and imaging results reviewed  Medications, allergies, PMH, PSH, SocH, FamH reviewed below    Follow-up instructions: Return for FOLLOW UP VIA MYCHART MESSAGE (SET TO SEND MONDAY).                                        Exam:  BP 138/83 (BP Location: Left Arm, Patient Position: Sitting, Cuff Size: Large)   Pulse 86   Temp 98.3 F (36.8 C) (Oral)   Wt (!) 304 lb 0.6 oz (137.9 kg)   BMI 39.04 kg/m   Constitutional: VS see above. General Appearance: alert, well-developed, well-nourished, NAD  Neck: No masses, trachea midline.   Respiratory: Normal respiratory  effort. no wheeze, no rhonchi, no rales  Cardiovascular: S1/S2 normal, no murmur, no rub/gallop auscultated. RRR.   Musculoskeletal: Gait normal. Symmetric and independent movement of all extremities  Neurological: Normal balance/coordination. No tremor.  Skin: warm, dry, intact.   Psychiatric: Normal judgment/insight. Normal mood and affect. Oriented x3.   Current Meds  Medication Sig  . atorvastatin (LIPITOR) 10 MG tablet Take 0.5 tablets (5 mg total) by mouth daily.  . chlorthalidone (HYGROTON) 25 MG tablet TAKE 1 TABLET(25 MG) BY MOUTH DAILY  . diazepam (VALIUM) 5 MG tablet Take 10 mg (2 tablets) po every 6 to 12 hours on day 1 quitting drinking if tremors or anxiety; 5 mg po q  8 hours for the next 3 to 5 days; 2.5 mg (half tablet) po bid prn  . naltrexone (DEPADE) 50 MG tablet Take 1 tablet (50 mg total) by mouth daily.  . naproxen (NAPROSYN) 500 MG tablet TAKE 1 TABLET(500 MG) BY MOUTH TWICE DAILY WITH A MEAL  . valsartan (DIOVAN) 160 MG tablet Take 1.5 tablets (240 mg total) by mouth daily.    Allergies  Allergen Reactions  . Amlodipine     Low extremity edema    Patient Active Problem List   Diagnosis Date Noted  . Hyperlipidemia 06/12/2019  . Tobacco abuse 11/07/2017  . Ankle fracture, left 08/14/2017  . Obstructive sleep apnea 01/06/2017  . Essential hypertension 12/22/2016  . Abscess of right axilla 12/22/2016    No family history on file.  Social History   Tobacco Use  Smoking Status Current Every Day Smoker  . Packs/day: 0.50  . Types: Cigarettes  Smokeless Tobacco Never Used    No past surgical history on file.  Immunization History  Administered Date(s) Administered  . PFIZER(Purple Top)SARS-COV-2 Vaccination 09/17/2019, 10/08/2019, 06/09/2020  . Tdap 08/14/2017    Recent Results (from the past 2160 hour(s))  Hepatic function panel     Status: None   Collection Time: 07/24/20  8:28 AM  Result Value Ref Range   Total Protein 7.2 6.0 - 8.5  g/dL   Albumin 4.6 4.0 - 5.0 g/dL   Bilirubin Total 0.6 0.0 - 1.2 mg/dL   Bilirubin, Direct 7.42 0.00 - 0.40 mg/dL   Alkaline Phosphatase 48 44 - 121 IU/L   AST 22 0 - 40 IU/L   ALT 36 0 - 44 IU/L  Lipid panel     Status: Abnormal   Collection Time: 07/24/20  8:28 AM  Result Value Ref Range   Cholesterol, Total 199 100 - 199 mg/dL   Triglycerides 595 (H) 0 - 149 mg/dL   HDL 48 >63 mg/dL   VLDL Cholesterol Cal 31 5 - 40 mg/dL   LDL Chol Calc (NIH) 875 (H) 0 - 99 mg/dL   Chol/HDL Ratio 4.1 0.0 - 5.0 ratio    Comment:                                   T. Chol/HDL Ratio                                             Men  Women                               1/2 Avg.Risk  3.4    3.3                                   Avg.Risk  5.0    4.4                                2X Avg.Risk  9.6    7.1                                3X  Avg.Risk 23.4   11.0     No results found.     All questions at time of visit were answered - patient instructed to contact office with any additional concerns or updates. ER/RTC precautions were reviewed with the patient as applicable.   Please note: manual typing as well as voice recognition software may have been used to produce this document - typos may escape review. Please contact Dr. Lyn Hollingshead for any needed clarifications.

## 2020-08-13 NOTE — Patient Instructions (Addendum)
Will get labs 09/01/20 or around that time   Rx sent for Naltrexone to curb alcohols cravings  Rx sent for Valium if needed for withdrawal management/prevention   See below for reasons to seek emergency care!   Get back to be with name of a therapist! (MyChart will remind yo on Monday!)      Alcohol Withdrawal Syndrome Alcohol withdrawal syndrome is a group of symptoms that can develop when a person who drinks heavily and regularly stops drinking or drinks less. Alcohol withdrawal syndrome can be mild or severe, and it may even be life-threatening. Alcohol withdrawal syndrome usually affects people who have alcohol use disorder, which may also be called alcoholism. Alcohol use disorder is when a person is unable to control his or her alcohol use, and drinking too much or too often causes problems at home, at work, or in relationships. What are the causes? Drinking heavily and drinking on a regular basis cause changes in brain chemistry. Over time, the body becomes dependent on alcohol. When alcohol use stops, the chemistry system in the brain becomes unbalanced and causes the symptoms of alcohol withdrawal. What increases the risk? Alcohol withdrawal syndrome is more likely to occur in people who drink more than the recommended limit of alcohol (2 drinks a day for men or 1 drink a day for non-pregnant women). It is also more likely to affect heavy drinkers who have been using alcohol for long periods of time. The more a person drinks and the longer he or she drinks, the greater the risk of alcohol withdrawal syndrome. Severe withdrawal is more likely to develop in someone who:  Had severe alcohol withdrawal in the past.  Had a seizure during a previous episode of alcohol withdrawal.  Is elderly.  Uses other drugs.  Has a long-term (chronic) medical problem, such as heart, lung, or liver disease.  Has depression.  Does not get enough nutrients from his or her diet  (malnutrition). What are the signs or symptoms? Symptoms of this condition can be mild to moderate, or they can be severe. Symptoms may develop a few hours (or up to a day) after a person changes his or her drinking patterns. During the 48 hours after he or she has stopped drinking, the following symptoms may go away or get better:  Uncontrollable shaking (tremor).  Sweating.  Headache.  Anxiety.  Inability to relax (agitation).  Trouble sleeping (insomnia).  Irregular heartbeats (palpitations).  Alcohol cravings.  Seizure. The following symptoms may get worse 24-48 hours after a person has decreased or stopped alcohol use, and they may gradually improve over a period of days or weeks:  Nausea and vomiting.  Fatigue.  Sensitivity to light and sounds.  Confusion and inability to think clearly.  Loss of appetite.  Mood swings, irritability, depression, and anxiety.  Insomnia and nightmares. The following symptoms are severe and life-threatening. When these symptoms occur together, they are called delirium tremens (DTs):  High blood pressure.  Increased heart rate.  Trouble breathing.  Seizures. These may go away along with other symptoms, or they may persist.  Seeing, hearing, feeling, smelling, or tasting things that are not there (hallucinations). If you experience hallucinations, they usually begin 12-24 hours after a change in drinking patterns. Delirium tremens requires immediate hospitalization. How is this diagnosed? This condition may be diagnosed based on:  Your symptoms and medical history.  Your history of alcohol use. Your health care provider may ask questions about your drinking behavior. It is  important to be honest when you answer these questions.  A psychological assessment.  A physical exam.  Blood tests or urine tests to measure blood alcohol level and to rule out other causes of symptoms.  MRI or CT scan. This may be done if you seem to  have abnormal thinking or behaviors (altered mental status). Diagnosis can be difficult. People going through withdrawal often avoid seeking medical care and are not thinking clearly. Friends and family members play an important role in recognizing symptoms and encouraging loved ones to get treatment. How is this treated? Most people with symptoms of withdrawal can be treated outside of a hospital setting (outpatient treatment), with close monitoring such as daily check-ins with a health care provider and counseling. You may need treatment at a hospital or treatment center (inpatient treatment) if:  You have a history of delirium tremens or seizures.  You have severe symptoms.  You are addicted to other drugs.  You cannot swallow medicine.  You have a serious medical condition such as heart failure.  You experienced withdrawal in the past but then you continued drinking alcohol.  You are not likely to commit to an outpatient treatment schedule. Treatment may involve:  Monitoring your blood pressure, pulse, and breathing.  IV fluids to keep you hydrated.  Medicines to reduce withdrawal symptoms and discomfort (benzodiazepines).  Medicine to reduce anxiety.  Medicine to prevent or control seizures.  Multivitamins and B vitamins.  Having a health care provider check on you daily. It is important to get treatment for alcohol withdrawal early. Getting treatment early can:  Speed up your recovery from withdrawal symptoms.  Make you more successful with long-term stoppage of alcohol use (sobriety). If you need help to stop drinking, your health care provider may recommend a long-term treatment plan that includes:  Medicines to help treat alcohol use disorder.  Substance abuse counseling.  Support groups. Follow these instructions at home:  Take over-the-counter and prescription medicines (including vitamin supplements) only as told by your health care provider.  Do not drink  alcohol.  Do not drive until your health care provider approves.  Have someone you trust stay with you or be available if you need help with your symptoms or with not drinking.  Drink enough fluid to keep your urine pale yellow.  Consider joining an alcohol support group or treatment program. These can provide emotional support, advice, and guidance.  Keep all follow-up visits as told by your health care provider. This is important.   Contact a health care provider if:  Your symptoms get worse instead of better.  You cannot eat or drink without vomiting.  You are struggling with not drinking alcohol.  You cannot stop drinking alcohol. Get help right away if:  You have an irregular heartbeat.  You have chest pain.  You have trouble breathing.  You have a seizure for the first time.  You hallucinate.  You become very confused. Summary  Alcohol withdrawal is a group of symptoms that can develop when a person who drinks heavily and regularly stops drinking or drinks less.  Symptoms of this condition can be mild to moderate, or they can be severe.  Treatment may include hospitalization, medicine, and counseling. This information is not intended to replace advice given to you by your health care provider. Make sure you discuss any questions you have with your health care provider. Document Revised: 06/02/2017 Document Reviewed: 02/24/2017 Elsevier Patient Education  2021 [employer].

## 2020-09-02 ENCOUNTER — Other Ambulatory Visit: Payer: Self-pay | Admitting: Osteopathic Medicine

## 2021-01-19 ENCOUNTER — Ambulatory Visit: Payer: Commercial Managed Care - PPO | Admitting: Cardiovascular Disease

## 2021-03-05 ENCOUNTER — Other Ambulatory Visit: Payer: Self-pay

## 2021-03-05 MED ORDER — CHLORTHALIDONE 25 MG PO TABS: ORAL_TABLET | ORAL | 1 refills | Status: DC

## 2021-03-05 MED ORDER — VALSARTAN 160 MG PO TABS: 240.0000 mg | ORAL_TABLET | Freq: Every day | ORAL | 1 refills | Status: DC

## 2021-05-07 ENCOUNTER — Telehealth: Payer: Self-pay | Admitting: Pharmacist

## 2021-05-07 MED ORDER — CHLORTHALIDONE 25 MG PO TABS: ORAL_TABLET | ORAL | 1 refills | Status: DC

## 2021-05-07 MED ORDER — VALSARTAN 160 MG PO TABS: 240.0000 mg | ORAL_TABLET | Freq: Every day | ORAL | 1 refills | Status: DC

## 2021-05-07 NOTE — Telephone Encounter (Signed)
Received notice patient out of refills of HTN medications.  Patient will schedule appt with Dr Allyson Sabal

## 2021-05-25 ENCOUNTER — Telehealth: Payer: Self-pay

## 2021-05-25 NOTE — Telephone Encounter (Signed)
Letter has been sent to patient informing them that their sleep study has expired. Patient will need to call and schedule an office visit to re-evaluate the need for a sleep study.    

## 2021-05-30 ENCOUNTER — Other Ambulatory Visit: Payer: Self-pay | Admitting: Cardiovascular Disease

## 2021-10-29 ENCOUNTER — Ambulatory Visit: Payer: Commercial Managed Care - PPO | Admitting: Physician Assistant

## 2021-11-26 ENCOUNTER — Ambulatory Visit: Payer: Commercial Managed Care - PPO | Admitting: Cardiovascular Disease

## 2021-12-24 ENCOUNTER — Ambulatory Visit: Payer: Commercial Managed Care - PPO | Admitting: Cardiovascular Disease

## 2021-12-29 ENCOUNTER — Encounter: Payer: Self-pay | Admitting: Cardiovascular Disease

## 2022-01-17 ENCOUNTER — Telehealth: Payer: Self-pay | Admitting: Cardiovascular Disease

## 2022-01-17 NOTE — Telephone Encounter (Signed)
Called pt and LVMTCB regarding return mail of a no show letter.

## 2022-02-03 ENCOUNTER — Telehealth: Payer: Self-pay | Admitting: Cardiovascular Disease

## 2022-02-03 MED ORDER — VALSARTAN 160 MG PO TABS: 240.0000 mg | ORAL_TABLET | Freq: Every day | ORAL | 1 refills | Status: DC

## 2022-02-03 NOTE — Telephone Encounter (Signed)
*  STAT* If patient is at the pharmacy, call can be transferred to refill team.   1. Which medications need to be refilled? (please list name of each medication and dose if known) valsartan (DIOVAN) 160 MG tablet chlorthalidone (HYGROTON) 25 MG tablet atorvastatin (LIPITOR) 10 MG tablet  2. Which pharmacy/location (including street and city if local pharmacy) is medication to be sent to? [id number] - [place], [place] - [address] AT [facility] OF [address]  3. Do they need a 30 day or 90 day supply? 30 day  Patient only has one day left.

## 2022-02-03 NOTE — Addendum Note (Signed)
Addended by: Orlene Och on: 02/03/2022 09:29 AM   Modules accepted: Orders

## 2022-03-06 ENCOUNTER — Other Ambulatory Visit: Payer: Self-pay | Admitting: Cardiovascular Disease

## 2022-03-08 ENCOUNTER — Other Ambulatory Visit: Payer: Self-pay

## 2022-03-08 NOTE — Telephone Encounter (Signed)
[facility] requesting refill of Valsartan.  Pt has upcoming appt with Dr Allyson Sabal scheduled for 04/12/22.  Please refill rx to [facility].  Thanks

## 2022-03-30 ENCOUNTER — Telehealth: Payer: Self-pay | Admitting: Cardiovascular Disease

## 2022-03-30 MED ORDER — ATORVASTATIN CALCIUM 10 MG PO TABS: ORAL_TABLET | ORAL | 3 refills | Status: DC

## 2022-03-30 MED ORDER — CHLORTHALIDONE 25 MG PO TABS: ORAL_TABLET | ORAL | 1 refills | Status: DC

## 2022-03-30 NOTE — Telephone Encounter (Signed)
*  STAT* If patient is at the pharmacy, call can be transferred to refill team.   1. Which medications need to be refilled? (please list name of each medication and dose if known)   atorvastatin (LIPITOR) 10 MG tablet    chlorthalidone (HYGROTON) 25 MG tablet    2. Which pharmacy/location (including street and city if local pharmacy) is medication to be sent to? [place], [place] - [address] AT [place]  3. Do they need a 30 day or 90 day supply?  30 day

## 2022-04-01 ENCOUNTER — Other Ambulatory Visit: Payer: Self-pay | Admitting: Cardiovascular Disease

## 2022-04-12 ENCOUNTER — Ambulatory Visit: Payer: Commercial Managed Care - PPO | Attending: Cardiovascular Disease | Admitting: Cardiovascular Disease

## 2022-04-12 ENCOUNTER — Encounter: Payer: Self-pay | Admitting: Cardiovascular Disease

## 2022-04-12 VITALS — BP 116/82 | HR 74 | Wt 290.0 lb

## 2022-04-12 DIAGNOSIS — I10 Essential (primary) hypertension: Secondary | ICD-10-CM

## 2022-04-12 DIAGNOSIS — E785 Hyperlipidemia, unspecified: Secondary | ICD-10-CM | POA: Diagnosis not present

## 2022-04-12 DIAGNOSIS — Z720 Tobacco use: Secondary | ICD-10-CM | POA: Diagnosis not present

## 2022-04-12 NOTE — Progress Notes (Signed)
04/12/2022 Nathan Garcia   1975/12/02  [id number]  Primary Physician Sunnie Nielsen, DO Primary Cardiologist: Runell Gess MD [name], [place], [place]  HPI:  Nathan Garcia Nathan Garcia is a 46 y.o.    moderately overweight Caucasian male with no children. 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 He was referred by Wyline Copas for coronary vessel evaluation and treatment of his hypertension. I last saw him in the office 06/12/2019. His cardiac risk factors profile is notable for tobacco abuse having smoked one half pack per day for the last 25 years. He is unaware of his lipid profile. He is not diabetic. He does have newly recognized hypertension. There is no family history. He denies chest pain or shortness of breath. He never had a heart attack or stroke. He does admit to dietary indiscretion with regards to salt as well as symptoms compatible with obstructive sleep apnea.    He was seeing [name] in the office for antihypertensive medication titration.  Blood pressures have been under better control since he is been taking his medicines as directed.  He was taken off the Bystolic.  He continues to smoke 3 - 5 cigarettes a day.   Since I saw him a year ago he continues to do well.  He denies chest pain or shortness of breath.  He does continue to smoke however.  He continues to smoke 5 to 6 cigarettes a day.  He denies chest pain or shortness of breath.  He did have a coronary calcium score performed 07/09/2019 which was 3.  Current Meds  Medication Sig   atorvastatin (LIPITOR) 10 MG tablet TAKE 1/2 TABLET(5 MG) BY MOUTH DAILY   chlorthalidone (HYGROTON) 25 MG tablet TAKE 1 TABLET(25 MG) BY MOUTH DAILY   diazepam (VALIUM) 5 MG tablet Take 10 mg (2 tablets) po every 6 to 12 hours on day 1 quitting drinking if tremors or anxiety; 5 mg po q 8 hours for the next 3 to 5 days; 2.5 mg (half tablet) po bid prn   naltrexone (DEPADE) 50 MG tablet Take 1 tablet (50 mg  total) by mouth daily.   naproxen (NAPROSYN) 500 MG tablet TAKE 1 TABLET(500 MG) BY MOUTH TWICE DAILY WITH A MEAL   valsartan (DIOVAN) 160 MG tablet TAKE 1 AND 1/2 TABLETS(240 MG) BY MOUTH DAILY     Allergies  Allergen Reactions   Amlodipine     Low extremity edema    Social History   Socioeconomic History   Marital status: Single    Spouse name: Not on file   Number of children: Not on file   Years of education: Not on file   Highest education level: Not on file  Occupational History   Not on file  Tobacco Use   Smoking status: Every Day    Packs/day: 0.50    Types: Cigarettes   Smokeless tobacco: Never  Substance and Sexual Activity   Alcohol use: Yes   Drug use: No   Sexual activity: Not on file  Other Topics Concern   Not on file  Social History Narrative   ** Merged History Encounter **       Social Determinants of Health   Financial Resource Strain: Not on file  Food Insecurity: Not on file  Transportation Needs: Not on file  Physical Activity: Not on file  Stress: Not on file  Social Connections: Not on file  Intimate Partner Violence: Not on file  Review of Systems: General: negative for chills, fever, night sweats or weight changes.  Cardiovascular: negative for chest pain, dyspnea on exertion, edema, orthopnea, palpitations, paroxysmal nocturnal dyspnea or shortness of breath Dermatological: negative for rash Respiratory: negative for cough or wheezing Urologic: negative for hematuria Abdominal: negative for nausea, vomiting, diarrhea, bright red blood per rectum, melena, or hematemesis Neurologic: negative for visual changes, syncope, or dizziness All other systems reviewed and are otherwise negative except as noted above.    Blood pressure 116/82, pulse 74, weight 290 lb (131.5 kg), SpO2 99 %.  General appearance: alert and no distress Neck: no adenopathy, no carotid bruit, no JVD, supple, symmetrical, trachea midline, and thyroid not  enlarged, symmetric, no tenderness/mass/nodules Lungs: clear to auscultation bilaterally Heart: regular rate and rhythm, S1, S2 normal, no murmur, click, rub or gallop Extremities: extremities normal, atraumatic, no cyanosis or edema Pulses: 2+ and symmetric Skin: Skin color, texture, turgor normal. No rashes or lesions Neurologic: Grossly normal  EKG sinus rhythm at 74 without ST or T wave changes.  Personally reviewed this EKG.  ASSESSMENT AND PLAN:   Essential hypertension History of essential hypertension blood pressure measured today at 116/82.  He is on chlorthalidone and valsartan.  Tobacco abuse History of ongoing tobacco use of 5 to 6 cigarettes a day down from 1 pack/day.  Hyperlipidemia History of hyperlipidemia with lipid profile performed 07/24/2020 revealing total cholesterol 199, LDL 120 and HDL 48.  He was started on low-dose atorvastatin after that.  We will recheck a fasting lipid liver profile.     Lorretta Harp MD FACP,FACC,FAHA, [facility] 04/12/2022 3:32 PM

## 2022-04-12 NOTE — Assessment & Plan Note (Signed)
History of essential hypertension blood pressure measured today at 116/82.  He is on chlorthalidone and valsartan.

## 2022-04-12 NOTE — Assessment & Plan Note (Signed)
History of ongoing tobacco use of 5 to 6 cigarettes a day down from 1 pack/day.

## 2022-04-12 NOTE — Patient Instructions (Signed)
Medication Instructions:  Your physician recommends that you continue on your current medications as directed. Please refer to the Current Medication list given to you today.  *If you need a refill on your cardiac medications before your next appointment, please call your pharmacy*  Lab Work: Please return for FASTING labs  (Lipid, hepatic)  Our in office lab hours are Monday-Friday 8:00-4:00, closed for lunch 12:45-1:45 pm.  No appointment needed.  [facility] locations:   [place] [address] (Dr. Kennon Holter office) - [place] ([facility]) - [address] [place] [address] - [address] [place] - [address] ([facility])  Follow-Up: At [facility], you and your health needs are our priority.  As part of our continuing mission to provide you with exceptional heart care, we have created designated Provider Care Teams.  These Care Teams include your primary Cardiologist (physician) and Advanced Practice Providers (APPs -  Physician Assistants and Nurse Practitioners) who all work together to provide you with the care you need, when you need it.  We recommend signing up for the patient portal called "MyChart".  Sign up information is provided on this After Visit Summary.  MyChart is used to connect with patients for Virtual Visits (Telemedicine).  Patients are able to view lab/test results, encounter notes, upcoming appointments, etc.  Non-urgent messages can be sent to your provider as well.   To learn more about what you can do with MyChart, go to [email or web address].    Your next appointment:   12 month(s)  The format for your next appointment:   In Person  Provider:   Dr. Gwenlyn Found

## 2022-04-12 NOTE — Assessment & Plan Note (Signed)
History of hyperlipidemia with lipid profile performed 07/24/2020 revealing total cholesterol 199, LDL 120 and HDL 48.  He was started on low-dose atorvastatin after that.  We will recheck a fasting lipid liver profile.

## 2022-05-01 ENCOUNTER — Other Ambulatory Visit: Payer: Self-pay | Admitting: Cardiovascular Disease

## 2022-06-10 ENCOUNTER — Ambulatory Visit: Payer: Commercial Managed Care - PPO | Admitting: Family Medicine

## 2022-06-10 ENCOUNTER — Encounter: Payer: Self-pay | Admitting: Family Medicine

## 2022-06-10 VITALS — BP 134/82 | HR 96 | Temp 97.6°F | Ht 75.0 in | Wt 294.8 lb

## 2022-06-10 DIAGNOSIS — I10 Essential (primary) hypertension: Secondary | ICD-10-CM

## 2022-06-10 DIAGNOSIS — Z720 Tobacco use: Secondary | ICD-10-CM | POA: Diagnosis not present

## 2022-06-10 DIAGNOSIS — Q170 Accessory auricle: Secondary | ICD-10-CM

## 2022-06-10 DIAGNOSIS — E782 Mixed hyperlipidemia: Secondary | ICD-10-CM | POA: Diagnosis not present

## 2022-06-10 DIAGNOSIS — F1090 Alcohol use, unspecified, uncomplicated: Secondary | ICD-10-CM

## 2022-06-10 NOTE — Progress Notes (Unsigned)
This of the skin  Assessment/Plan:   Problem List Items Addressed This Visit   None      Subjective:  HPI:  Nathan Garcia is a 46 y.o. male who has Essential hypertension; Abscess of right axilla; Obstructive sleep apnea; Ankle fracture, left; Tobacco abuse; and Hyperlipidemia on their problem list..   He  has a past medical history of Allergy (Pediatric), Hypertension, and Obstructive sleep apnea (01/06/2017).Marland Kitchen   He presents with chief complaint of Establish Care (Skin tag on left eyelid x 6 years. ) .   Alcohol "5" a day  Manages [place]   History reviewed. No pertinent surgical history.  Outpatient Medications Prior to Visit  Medication Sig Dispense Refill   atorvastatin (LIPITOR) 10 MG tablet TAKE 1/2 TABLET(5 MG) BY MOUTH DAILY 45 tablet 3   chlorthalidone (HYGROTON) 25 MG tablet TAKE 1 TABLET(25 MG) BY MOUTH DAILY 90 tablet 1   valsartan (DIOVAN) 160 MG tablet TAKE 1 AND 1/2 TABLETS(240 MG) BY MOUTH DAILY 60 tablet 3   diazepam (VALIUM) 5 MG tablet Take 10 mg (2 tablets) po every 6 to 12 hours on day 1 quitting drinking if tremors or anxiety; 5 mg po q 8 hours for the next 3 to 5 days; 2.5 mg (half tablet) po bid prn (Patient not taking: Reported on 06/10/2022) 30 tablet 0   naltrexone (DEPADE) 50 MG tablet Take 1 tablet (50 mg total) by mouth daily. (Patient not taking: Reported on 06/10/2022) 90 tablet 0   naproxen (NAPROSYN) 500 MG tablet TAKE 1 TABLET(500 MG) BY MOUTH TWICE DAILY WITH A MEAL (Patient not taking: Reported on 06/10/2022) 60 tablet 1   No facility-administered medications prior to visit.    History reviewed. No pertinent family history.  Social History   Socioeconomic History   Marital status: Single    Spouse name: Not on file   Number of children: Not on file   Years of education: Not on file   Highest education level: Not on file  Occupational History   Not on file  Tobacco Use   Smoking status: Every Day    Packs/day: 0.50     Years: 27.00    Total pack years: 13.50    Types: Cigarettes    Passive exposure: Never   Smokeless tobacco: Never  Vaping Use   Vaping Use: Never used  Substance and Sexual Activity   Alcohol use: Yes    Comment: Close to one fifth of vodka 5 to 6 evenings per week.   Drug use: No   Sexual activity: Not Currently  Other Topics Concern   Not on file  Social History Narrative   ** Merged History Encounter **       Social Determinants of Health   Financial Resource Strain: Not on file  Food Insecurity: Not on file  Transportation Needs: Not on file  Physical Activity: Not on file  Stress: Not on file  Social Connections: Not on file  Intimate Partner Violence: Not on file  Objective:  Physical Exam: BP 134/82 (BP Location: Left Arm, Patient Position: Sitting, Cuff Size: Large)   Pulse 96   Temp 97.6 F (36.4 C) (Temporal)   Ht 6\' 3"  (1.905 m)   Wt 294 lb 12.8 oz (133.7 kg)   SpO2 97%   BMI 36.85 kg/m    ***General: No acute distress. Awake and conversant.  Eyes: Normal conjunctiva, anicteric. Round symmetric pupils.  ENT: Hearing grossly intact. No nasal discharge.  Neck: Neck is supple. No masses or thyromegaly.  Respiratory: Respirations are non-labored. No auditory wheezing.  Skin: Warm. No rashes or ulcers.  Psych: Alert and oriented. Cooperative, Appropriate mood and affect, Normal judgment.  CV: No cyanosis or JVD MSK: Normal ambulation. No clubbing  Neuro: Sensation and CN II-XII grossly normal.        , MD, MS

## 2022-06-10 NOTE — Patient Instructions (Addendum)
It was a pleasure to see you today! Thank you for choosing [facility] for your primary care.   We are ordering labs as requested.  We are referring to ophthalmology.  The office will call you to help.

## 2022-09-01 ENCOUNTER — Telehealth: Payer: Self-pay | Admitting: Cardiovascular Disease

## 2022-09-01 NOTE — Telephone Encounter (Signed)
*  STAT* If patient is at the pharmacy, call can be transferred to refill team.   1. Which medications need to be refilled? (please list name of each medication and dose if known)   valsartan (DIOVAN) 160 MG tablet  chlorthalidone (HYGROTON) 25 MG tablet  atorvastatin (LIPITOR) 10 MG tablet  2. Which pharmacy/location (including street and city if local pharmacy) is medication to be sent to? [address]  [place], [place], [place]  3. Do they need a 30 day or 90 day supply? 30 day  [place],  [place], [place], [place]

## 2022-09-02 MED ORDER — VALSARTAN 160 MG PO TABS: ORAL_TABLET | ORAL | 2 refills | Status: DC

## 2022-09-02 MED ORDER — ATORVASTATIN CALCIUM 10 MG PO TABS: ORAL_TABLET | ORAL | 2 refills | Status: DC

## 2022-09-02 MED ORDER — CHLORTHALIDONE 25 MG PO TABS: ORAL_TABLET | ORAL | 2 refills | Status: DC

## 2023-02-20 ENCOUNTER — Other Ambulatory Visit (HOSPITAL_COMMUNITY): Payer: Self-pay

## 2023-02-21 ENCOUNTER — Other Ambulatory Visit (HOSPITAL_COMMUNITY): Payer: Self-pay

## 2023-02-23 ENCOUNTER — Other Ambulatory Visit (HOSPITAL_COMMUNITY): Payer: Self-pay

## 2023-02-23 ENCOUNTER — Other Ambulatory Visit: Payer: Self-pay

## 2023-02-23 MED ORDER — VALSARTAN 160 MG PO TABS
240.0000 mg | ORAL_TABLET | Freq: Every day | ORAL | 0 refills | Status: DC
  Filled 2023-02-23 (×2): qty 135, 90d supply, fill #0

## 2023-02-23 MED ORDER — ATORVASTATIN CALCIUM 10 MG PO TABS
5.0000 mg | ORAL_TABLET | Freq: Every day | ORAL | 0 refills | Status: DC
  Filled 2023-02-23 (×2): qty 45, 90d supply, fill #0

## 2023-02-23 MED ORDER — CHLORTHALIDONE 25 MG PO TABS
25.0000 mg | ORAL_TABLET | Freq: Every day | ORAL | 0 refills | Status: DC
  Filled 2023-02-23 (×2): qty 90, 90d supply, fill #0

## 2023-05-09 DIAGNOSIS — R0683 Snoring: Secondary | ICD-10-CM | POA: Diagnosis not present

## 2023-05-09 DIAGNOSIS — I10 Essential (primary) hypertension: Secondary | ICD-10-CM | POA: Diagnosis not present

## 2023-05-09 DIAGNOSIS — R251 Tremor, unspecified: Secondary | ICD-10-CM | POA: Diagnosis not present

## 2023-05-09 DIAGNOSIS — Q828 Other specified congenital malformations of skin: Secondary | ICD-10-CM | POA: Diagnosis not present

## 2023-05-09 DIAGNOSIS — R635 Abnormal weight gain: Secondary | ICD-10-CM | POA: Diagnosis not present

## 2023-05-09 DIAGNOSIS — Z6837 Body mass index (BMI) 37.0-37.9, adult: Secondary | ICD-10-CM | POA: Diagnosis not present

## 2023-05-09 DIAGNOSIS — F1010 Alcohol abuse, uncomplicated: Secondary | ICD-10-CM | POA: Diagnosis not present

## 2023-05-18 ENCOUNTER — Other Ambulatory Visit: Payer: Self-pay

## 2023-05-18 ENCOUNTER — Other Ambulatory Visit: Payer: Self-pay | Admitting: Cardiovascular Disease

## 2023-05-18 MED ORDER — CHLORTHALIDONE 25 MG PO TABS
25.0000 mg | ORAL_TABLET | Freq: Every day | ORAL | 0 refills | Status: DC
  Filled 2023-05-18: qty 90, 90d supply, fill #0

## 2023-05-18 MED ORDER — ATORVASTATIN CALCIUM 10 MG PO TABS
5.0000 mg | ORAL_TABLET | Freq: Every day | ORAL | 0 refills | Status: DC
  Filled 2023-05-18: qty 45, 90d supply, fill #0

## 2023-05-18 MED ORDER — VALSARTAN 160 MG PO TABS
240.0000 mg | ORAL_TABLET | Freq: Every day | ORAL | 0 refills | Status: DC
  Filled 2023-05-18: qty 135, 90d supply, fill #0

## 2023-05-19 ENCOUNTER — Other Ambulatory Visit (HOSPITAL_COMMUNITY): Payer: Self-pay

## 2023-06-07 ENCOUNTER — Telehealth: Payer: Self-pay

## 2023-06-07 NOTE — Telephone Encounter (Signed)
Patient came to triage requesting labs work orders. Fasting lipid and liver panel is still active. Printed out lab sheet for patient to go to any [facility] to have done. Patient verbalized understanding.

## 2023-06-13 ENCOUNTER — Ambulatory Visit: Payer: 59 | Attending: Cardiovascular Disease | Admitting: Cardiovascular Disease

## 2023-06-13 ENCOUNTER — Encounter: Payer: Self-pay | Admitting: Cardiovascular Disease

## 2023-06-13 VITALS — BP 122/82 | HR 68 | Ht 75.0 in | Wt 295.0 lb

## 2023-06-13 DIAGNOSIS — E782 Mixed hyperlipidemia: Secondary | ICD-10-CM

## 2023-06-13 DIAGNOSIS — G4733 Obstructive sleep apnea (adult) (pediatric): Secondary | ICD-10-CM | POA: Diagnosis not present

## 2023-06-13 DIAGNOSIS — I10 Essential (primary) hypertension: Secondary | ICD-10-CM | POA: Diagnosis not present

## 2023-06-13 DIAGNOSIS — Z720 Tobacco use: Secondary | ICD-10-CM

## 2023-06-13 NOTE — Assessment & Plan Note (Signed)
History of essential hypertension her blood pressure measured today at 122/82.  He is on chlorthalidone and valsartan.

## 2023-06-13 NOTE — Assessment & Plan Note (Signed)
Ongoing tobacco abuse of 5 to 6 cigarettes a day.  I am referring him to our life coach for assistance with smoking cessation.

## 2023-06-13 NOTE — Progress Notes (Signed)
06/13/2023 Nathan Garcia   28-Jun-1976  [id number]  Primary Physician Garnette Gunner, MD Primary Cardiologist: Runell Gess MD [name], [place], [place]  HPI:  Nathan Garcia is a 47 y.o.     moderately overweight Caucasian male with no children.  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  He was referred by Wyline Copas for coronary vessel evaluation and treatment of his hypertension. I last saw him in the office 06/12/2019. His cardiac risk factors profile is notable for tobacco abuse having smoked one half pack per day for the last 25 years. He is unaware of his lipid profile. He is not diabetic. He does have newly recognized hypertension. There is no family history. He denies chest pain or shortness of breath. He never had a heart attack or stroke. He does admit to dietary indiscretion with regards to salt as well as symptoms compatible with obstructive sleep apnea.     He was seeing [name] in the office for antihypertensive medication titration.  Blood pressures have been under better control since he is been taking his medicines as directed.  He was taken off the Bystolic.  He continues to smoke 3 - 5 cigarettes a day.   Since I saw him a year ago he continues to do well.  He denies chest pain or shortness of breath.  He does continue to smoke however.  He continues to smoke 5 to 6 cigarettes a day.   He did have a coronary calcium score performed 07/09/2019 which was 3.   Current Meds  Medication Sig   atorvastatin (LIPITOR) 10 MG tablet Take 0.5 tablets (5 mg total) by mouth daily.   chlorthalidone (HYGROTON) 25 MG tablet Take 1 tablet (25 mg total) by mouth daily.   loratadine (CLARITIN REDITABS) 10 MG dissolvable tablet Take 10 mg by mouth daily.   valsartan (DIOVAN) 160 MG tablet Take 1.5 tablets (240 mg total) by mouth daily.     Allergies  Allergen Reactions   Amlodipine     Low extremity  edema    Social History   Socioeconomic History   Marital status: Single    Spouse name: Not on file   Number of children: Not on file   Years of education: Not on file   Highest education level: Not on file  Occupational History   Not on file  Tobacco Use   Smoking status: Every Day    Current packs/day: 0.50    Average packs/day: 0.5 packs/day for 27.0 years (13.5 ttl pk-yrs)    Types: Cigarettes    Passive exposure: Never   Smokeless tobacco: Never  Vaping Use   Vaping status: Never Used  Substance and Sexual Activity   Alcohol use: Yes    Comment: Close to one fifth of vodka 5 to 6 evenings per week.   Drug use: No   Sexual activity: Not Currently  Other Topics Concern   Not on file  Social History Narrative   ** Merged History Encounter **       Social Determinants of Health   Financial Resource Strain: Not on file  Food Insecurity: Not on file  Transportation Needs: Not on file  Physical Activity: Not on file  Stress: Not on file  Social Connections: Not on file  Intimate Partner Violence: Not on file     Review of Systems: General: negative for chills, fever, night sweats or weight  changes.  Cardiovascular: negative for chest pain, dyspnea on exertion, edema, orthopnea, palpitations, paroxysmal nocturnal dyspnea or shortness of breath Dermatological: negative for rash Respiratory: negative for cough or wheezing Urologic: negative for hematuria Abdominal: negative for nausea, vomiting, diarrhea, bright red blood per rectum, melena, or hematemesis Neurologic: negative for visual changes, syncope, or dizziness All other systems reviewed and are otherwise negative except as noted above.    Blood pressure 122/82, pulse 68, height 6\' 3"  (1.905 m), weight 295 lb (133.8 kg), SpO2 98%.  General appearance: alert and no distress Neck: no adenopathy, no carotid bruit, no JVD, supple, symmetrical, trachea midline, and thyroid not enlarged, symmetric, no  tenderness/mass/nodules Lungs: clear to auscultation bilaterally Heart: regular rate and rhythm, S1, S2 normal, no murmur, click, rub or gallop Extremities: extremities normal, atraumatic, no cyanosis or edema Pulses: 2+ and symmetric Skin: Skin color, texture, turgor normal. No rashes or lesions Neurologic: Grossly normal  EKG EKG Interpretation Date/Time:  Tuesday June 13 2023 13:47:30 EST Ventricular Rate:  68 PR Interval:  160 QRS Duration:  86 QT Interval:  386 QTC Calculation: 410 R Axis:   10  Text Interpretation: Normal sinus rhythm Normal ECG No previous ECGs available Confirmed by Nanetta Batty [phone]) on 06/13/2023 2:27:38 PM    ASSESSMENT AND PLAN:   Essential hypertension History of essential hypertension her blood pressure measured today at 122/82.  He is on chlorthalidone and valsartan.  Obstructive sleep apnea Symptoms of obstructive sleep apnea being evaluated by his PCP.  Tobacco abuse Ongoing tobacco abuse of 5 to 6 cigarettes a day.  I am referring him to our life coach for assistance with smoking cessation.  Hyperlipidemia History of hyperlipidemia on low-dose statin therapy with lipid profile performed 05/09/2023 revealing a total cholesterol 177, LDL 91 and HDL of 57.     Runell Gess MD [facility], [facility] 06/13/2023 2:34 PM

## 2023-06-13 NOTE — Assessment & Plan Note (Signed)
Symptoms of obstructive sleep apnea being evaluated by his PCP.

## 2023-06-13 NOTE — Assessment & Plan Note (Signed)
History of hyperlipidemia on low-dose statin therapy with lipid profile performed 05/09/2023 revealing a total cholesterol 177, LDL 91 and HDL of 57.

## 2023-06-13 NOTE — Patient Instructions (Signed)

## 2023-06-22 ENCOUNTER — Telehealth: Payer: Self-pay

## 2023-06-22 DIAGNOSIS — Z0000 Encounter for general adult medical examination without abnormal findings: Secondary | ICD-10-CM

## 2023-06-22 NOTE — Telephone Encounter (Signed)
Called patient per health coaching referral for smoking cessation. Patient stated that he is interested in health coaching and requested to be scheduled for 08/01/22 at 4:00pm due to his work schedule. Patient has been scheduled accordingly.   Renaee Munda, MS, ERHD, [facility]  Care Guide, Health & Wellness Coach [address] [place] [address] Telephone: [phone] Email: [email or web address]

## 2023-07-29 ENCOUNTER — Other Ambulatory Visit (HOSPITAL_COMMUNITY): Payer: Self-pay

## 2023-07-29 ENCOUNTER — Other Ambulatory Visit: Payer: Self-pay

## 2023-07-29 ENCOUNTER — Ambulatory Visit
Admission: RE | Admit: 2023-07-29 | Discharge: 2023-07-29 | Disposition: A | Discharge status: Home / Self Care | Payer: 59 | Source: Ambulatory Visit | Attending: Family Medicine | Admitting: Family Medicine

## 2023-07-29 VITALS — BP 148/90 | HR 102 | Temp 98.4°F | Resp 18 | Ht 75.0 in | Wt 297.0 lb

## 2023-07-29 DIAGNOSIS — K0889 Other specified disorders of teeth and supporting structures: Secondary | ICD-10-CM | POA: Diagnosis not present

## 2023-07-29 DIAGNOSIS — R6884 Jaw pain: Secondary | ICD-10-CM

## 2023-07-29 MED ORDER — AMOXICILLIN-POT CLAVULANATE 875-125 MG PO TABS
1.0000 | ORAL_TABLET | Freq: Two times a day (BID) | ORAL | 0 refills | Status: DC
  Filled 2023-07-29: qty 14, 7d supply, fill #0

## 2023-07-29 MED ORDER — NAPROXEN 500 MG PO TABS
500.0000 mg | ORAL_TABLET | Freq: Two times a day (BID) | ORAL | 0 refills | Status: AC
  Filled 2023-07-29: qty 20, 10d supply, fill #0

## 2023-07-29 NOTE — ED Provider Notes (Signed)
[name]    CSN: [id number] Arrival date & time: 07/29/23  1147      History   Chief Complaint Chief Complaint  Patient presents with   Dental Pain    Tooth ache - would like an antibiotic and something to relieve pain/swelling until my dentist appointment scheduled for 08/09/2023.  Thank you. - Entered by patient     HPI Nathan Garcia is a 48 y.o. male.   The history is provided by the patient.  Dental Pain Dental pain left lower quadrant molar x 1 day admits painful chewing, admits local swelling and tenderness.  Has an appointment with his dentist 08/09/2023. Denies fever, chills, sore throat, change in voice, difficulty swallowing, difficulty breathing, cough, chest pain, shortness of breath, abdominal pain, nausea, vomiting, headache, ear pain, dizziness, rashes or skin changes.  Took a leftover naproxen with some relief.  Allergy to amlodipine.  Past Medical History:  Diagnosis Date   Allergy Pediatric   Hay fever   Hypertension    Obstructive sleep apnea 01/06/2017    Patient Active Problem List   Diagnosis Date Noted   Hyperlipidemia 06/12/2019   Tobacco abuse 11/07/2017   Ankle fracture, left 08/14/2017   Obstructive sleep apnea 01/06/2017   Essential hypertension 12/22/2016   Abscess of right axilla 12/22/2016    History reviewed. No pertinent surgical history.     Home Medications    Prior to Admission medications   Medication Sig Start Date End Date Taking? Authorizing Provider  atorvastatin (LIPITOR) 10 MG tablet Take 0.5 tablets (5 mg total) by mouth daily. 05/18/23   Runell Gess, MD  chlorthalidone (HYGROTON) 25 MG tablet Take 1 tablet (25 mg total) by mouth daily. 05/18/23   Runell Gess, MD  loratadine (CLARITIN REDITABS) 10 MG dissolvable tablet Take 10 mg by mouth daily.    [provider]  valsartan (DIOVAN) 160 MG tablet Take 1.5 tablets (240 mg total) by mouth daily. 05/18/23   Runell Gess, MD     Family History History reviewed. No pertinent family history.  Social History Social History   Tobacco Use   Smoking status: Every Day    Current packs/day: 0.50    Average packs/day: 0.5 packs/day for 27.0 years (13.5 ttl pk-yrs)    Types: Cigarettes    Passive exposure: Never   Smokeless tobacco: Never  Vaping Use   Vaping status: Never Used  Substance Use Topics   Alcohol use: Yes    Comment: Close to one fifth of vodka 5 to 6 evenings per week.   Drug use: No     Allergies   Amlodipine   Review of Systems Review of Systems   Physical Exam Triage Vital Signs ED Triage Vitals  Encounter Vitals Group     BP 07/29/23 1159 (!) 148/90     Systolic BP Percentile --      Diastolic BP Percentile --      Pulse Rate 07/29/23 1159 (!) 102     Resp 07/29/23 1159 18     Temp 07/29/23 1159 98.4 F (36.9 C)     Temp Source 07/29/23 1159 Oral     SpO2 07/29/23 1159 95 %     Weight 07/29/23 1157 297 lb (134.7 kg)     Height 07/29/23 1157 6\' 3"  (1.905 m)     Head Circumference --      Peak Flow --      Pain Score 07/29/23 1157 6  Pain Loc --      Pain Education --      Exclude from Growth Chart --    No data found.  Updated Vital Signs BP (!) 148/90 (BP Location: Right Arm)   Pulse (!) 102   Temp 98.4 F (36.9 C) (Oral)   Resp 18   Ht 6\' 3"  (1.905 m)   Wt 297 lb (134.7 kg)   SpO2 95%   BMI 37.12 kg/m   Visual Acuity Right Eye Distance:   Left Eye Distance:   Bilateral Distance:    Right Eye Near:   Left Eye Near:    Bilateral Near:     Physical Exam Vitals and nursing note reviewed.  HENT:     Head: Atraumatic.     Right Ear: Tympanic membrane and ear canal normal.     Left Ear: Tympanic membrane and ear canal normal.     Nose: No rhinorrhea.     Mouth/Throat:     Mouth: Mucous membranes are moist.     Pharynx: Uvula midline. No uvula swelling.     Tonsils: No tonsillar exudate.     Comments: Minimal tenderness second molar left lower  quadrant no obvious abscess cavity or fracture Cardiovascular:     Rate and Rhythm: Normal rate and regular rhythm.     Heart sounds: Normal heart sounds.  Pulmonary:     Effort: Pulmonary effort is normal.     Breath sounds: Normal breath sounds.  Musculoskeletal:     Cervical back: Neck supple.  Lymphadenopathy:     Cervical: No cervical adenopathy.  Skin:    General: Skin is warm and dry.  Neurological:     Mental Status: He is alert and oriented to person, place, and time.      UC Treatments / Results  Labs (all labs ordered are listed, but only abnormal results are displayed) Labs Reviewed - No data to display  EKG   Radiology No results found.  Procedures Procedures (including critical care time)  Medications Ordered in UC Medications - No data to display  Initial Impression / Assessment and Plan / UC Course  I have reviewed the triage vital signs and the nursing notes.  Pertinent labs & imaging results that were available during my care of the patient were reviewed by me and considered in my medical decision making (see chart for details).     48 year old male with left lower quadrant toothache, pain with chewing and jaw pain for 1 day has an appointment with his dentist, minimal tenderness on exam will treat with antibiotic and NSAID recommend keeping appointment with his dentist.  Go to ED for worsening symptoms such as development of fever, trismus, difficulty swallowing, chest pain or shortness of breath Final Clinical Impressions(s) / UC Diagnoses   Final diagnoses:  None   Discharge Instructions   None    ED Prescriptions   None    PDMP not reviewed this encounter.   Meliton Rattan, [place] 07/29/23 1212

## 2023-07-29 NOTE — ED Triage Notes (Addendum)
Pt presents with left sided (lower) dental pain x 1 day. Pt reports he currently has a dental appointment scheduled for 2/5. Unsure if the area is infected though it does feel infected per patient description. Pt currently rates his pain as a 6/10, describes as tender and swollen. 500 mg of Aleve taken this morning with improvement in pain.

## 2023-07-29 NOTE — Discharge Instructions (Signed)
Keep your appointment with your dentist Seek medical attention for worsening symptoms including development of fever, facial swelling, difficulty swallowing, inability to open mouth fully, difficulty breathing, chest pain, shortness of breath

## 2023-08-02 ENCOUNTER — Ambulatory Visit: Payer: 59

## 2023-08-02 ENCOUNTER — Other Ambulatory Visit (HOSPITAL_COMMUNITY): Payer: Self-pay

## 2023-08-02 ENCOUNTER — Telehealth: Payer: Self-pay

## 2023-08-02 DIAGNOSIS — I10 Essential (primary) hypertension: Secondary | ICD-10-CM | POA: Diagnosis not present

## 2023-08-02 DIAGNOSIS — R0683 Snoring: Secondary | ICD-10-CM | POA: Diagnosis not present

## 2023-08-02 DIAGNOSIS — Z0000 Encounter for general adult medical examination without abnormal findings: Secondary | ICD-10-CM

## 2023-08-02 NOTE — Telephone Encounter (Signed)
Patient called in to reschedule health coaching appointment due to his work schedule. Patient requested to be rescheduled for any time in March 2025. Patient has been rescheduled as requested.   Renaee Munda, MS, ERHD, [facility]  Care Guide, Health & Wellness Coach [address], [address] [place] [address] Telephone: [phone] Email: [email or web address]

## 2023-08-03 ENCOUNTER — Other Ambulatory Visit (HOSPITAL_COMMUNITY): Payer: Self-pay

## 2023-08-03 MED ORDER — POTASSIUM CHLORIDE ER 20 MEQ PO TBCR
20.0000 meq | EXTENDED_RELEASE_TABLET | Freq: Every day | ORAL | 1 refills | Status: DC
  Filled 2023-08-03 – 2023-08-16 (×2): qty 30, 30d supply, fill #0
  Filled 2023-09-11: qty 30, 30d supply, fill #1

## 2023-08-03 MED ORDER — VITAMIN D3 1.25 MG (50000 UT) PO CAPS
1.0000 | ORAL_CAPSULE | ORAL | 3 refills | Status: AC
  Filled 2023-08-03 – 2023-08-16 (×2): qty 12, 84d supply, fill #0
  Filled 2023-11-10: qty 12, 84d supply, fill #1
  Filled 2024-01-29: qty 12, 84d supply, fill #2
  Filled 2024-04-30: qty 12, 84d supply, fill #3

## 2023-08-15 ENCOUNTER — Other Ambulatory Visit (HOSPITAL_COMMUNITY): Payer: Self-pay

## 2023-08-16 ENCOUNTER — Other Ambulatory Visit: Payer: Self-pay

## 2023-08-16 ENCOUNTER — Other Ambulatory Visit: Payer: Self-pay | Admitting: Cardiovascular Disease

## 2023-08-16 ENCOUNTER — Other Ambulatory Visit (HOSPITAL_COMMUNITY): Payer: Self-pay

## 2023-08-16 MED ORDER — CHLORTHALIDONE 25 MG PO TABS
25.0000 mg | ORAL_TABLET | Freq: Every day | ORAL | 3 refills | Status: AC
  Filled 2023-08-16: qty 90, 90d supply, fill #0
  Filled 2023-11-16: qty 90, 90d supply, fill #1
  Filled 2024-02-18: qty 90, 90d supply, fill #2
  Filled 2024-05-15: qty 90, 90d supply, fill #3

## 2023-08-16 MED ORDER — VALSARTAN 160 MG PO TABS
240.0000 mg | ORAL_TABLET | Freq: Every day | ORAL | 3 refills | Status: AC
  Filled 2023-08-16: qty 135, 90d supply, fill #0
  Filled 2023-11-16: qty 135, 90d supply, fill #1
  Filled 2024-02-18: qty 135, 90d supply, fill #2
  Filled 2024-05-15: qty 135, 90d supply, fill #3

## 2023-08-16 MED ORDER — ATORVASTATIN CALCIUM 10 MG PO TABS
5.0000 mg | ORAL_TABLET | Freq: Every day | ORAL | 3 refills | Status: AC
  Filled 2023-08-16: qty 45, 90d supply, fill #0
  Filled 2023-11-16: qty 45, 90d supply, fill #1
  Filled 2024-02-18: qty 45, 90d supply, fill #2
  Filled 2024-05-15: qty 45, 90d supply, fill #3

## 2023-09-05 ENCOUNTER — Encounter (HOSPITAL_BASED_OUTPATIENT_CLINIC_OR_DEPARTMENT_OTHER): Payer: Self-pay

## 2023-09-05 NOTE — Progress Notes (Signed)
 MyChart message was forwarded from triage from patient requesting that his initial health coaching appointment be rescheduled to April 2025 due to scheduling issues with work. Patient has been rescheduled to October 11, 2023 @ 3:45pm in-person at [facility].   Renaee Munda, MS, ERHD, [facility]  Care Guide, Health & Wellness Coach [address], [address] [place] [address] Telephone: [phone] Email: [email or web address]

## 2023-09-07 ENCOUNTER — Ambulatory Visit: Payer: 59

## 2023-09-11 ENCOUNTER — Other Ambulatory Visit (HOSPITAL_COMMUNITY): Payer: Self-pay

## 2023-10-09 ENCOUNTER — Other Ambulatory Visit (HOSPITAL_COMMUNITY): Payer: Self-pay

## 2023-10-10 ENCOUNTER — Other Ambulatory Visit (HOSPITAL_COMMUNITY): Payer: Self-pay

## 2023-10-10 MED ORDER — POTASSIUM CHLORIDE ER 20 MEQ PO TBCR
20.0000 meq | EXTENDED_RELEASE_TABLET | Freq: Every day | ORAL | 3 refills | Status: DC
  Filled 2023-10-16: qty 30, 30d supply, fill #0
  Filled 2023-11-10: qty 30, 30d supply, fill #1
  Filled 2023-12-10: qty 30, 30d supply, fill #2
  Filled 2024-01-09: qty 30, 30d supply, fill #3

## 2023-10-11 ENCOUNTER — Other Ambulatory Visit: Payer: Self-pay

## 2023-10-11 ENCOUNTER — Ambulatory Visit
Admission: RE | Admit: 2023-10-11 | Discharge: 2023-10-11 | Disposition: A | Discharge status: Home / Self Care | Source: Ambulatory Visit | Attending: Physician Assistant | Admitting: Physician Assistant

## 2023-10-11 ENCOUNTER — Ambulatory Visit

## 2023-10-11 VITALS — BP 124/81 | HR 106 | Temp 98.8°F | Resp 19 | Ht 75.0 in | Wt 290.0 lb

## 2023-10-11 DIAGNOSIS — R5081 Fever presenting with conditions classified elsewhere: Secondary | ICD-10-CM | POA: Diagnosis not present

## 2023-10-11 DIAGNOSIS — U071 COVID-19: Secondary | ICD-10-CM

## 2023-10-11 DIAGNOSIS — R051 Acute cough: Secondary | ICD-10-CM | POA: Diagnosis not present

## 2023-10-11 LAB — POC COVID19/FLU A&B COMBO
Covid Antigen, POC: POSITIVE — AB
Influenza A Antigen, POC: NEGATIVE
Influenza B Antigen, POC: NEGATIVE

## 2023-10-11 LAB — POCT RAPID STREP A (OFFICE): Rapid Strep A Screen: NEGATIVE

## 2023-10-11 NOTE — Progress Notes (Signed)
 Patient is not able to make his appointment today per [facility]. Patient is not feeling well due to sinus issues. Patient's appointment was requested to be cancelled and he would reschedule for a later date.   Renaee Munda, MS, ERHD, [facility]  Care Guide [facility] Navigation Telephone: [phone] Email: [email or web address]

## 2023-10-11 NOTE — ED Triage Notes (Addendum)
 Pt presents with complaints of fatigue, congestion, sore throat, and chills x 1 day. Pt's coworker did test positive for strep throat recently. Pt currently rates his overall pain a 4/10. Claritin taken daily.

## 2023-10-11 NOTE — Discharge Instructions (Addendum)
  You were seen today for concerns for upper respiratory symptoms.  Your rapid testing was positive for COVID.  You tested negative for flu and strep.   After discussing potential treatment options with you we decided not to proceed with Paxlovid administration and look into over-the-counter medications to help control your symptoms until you feel better.  Symptoms can last for 3-10 days with lingering cough and intermittent symptoms lasting weeks after that.  The goal of treatment at this time is to reduce your symptoms and discomfort   I recommend using Robitussin and Mucinex (regular formulations, nothing with decongestants or DM)  You can also use Tylenol for body aches and fever reduction I also recommend adding an antihistamine to your daily regimen This includes medications like Claritin, Allegra, Zyrtec- the generics of these work very well and are usually less expensive I recommend using Flonase nasal spray - 2 puffs twice per day to help with your nasal congestion The antihistamines and Flonase can take a few weeks to provide significant relief from allergy symptoms but should start to provide some benefit soon. You can use a humidifier at night to help with preventing nasal dryness and irritation   If your symptoms are not improving or seem to be getting worse over the next 5 to 7 days you can always return here to urgent care or you can follow-up with your primary care provider for ongoing management I do recommend quarantining at home for the next 4 days to help prevent transmission to other people.  Once he returned to work on Monday I recommend wearing a mask for the next 5 days to help prevent transmission to those that you work with.  I have provided you a work note to excuse you.  It is included in your paperwork.  Go to the ER if you begin to have more serious symptoms such as shortness of breath, trouble breathing, loss of consciousness, swelling around the eyes, high fever,  severe lasting headaches, vision changes or neck pain/stiffness.

## 2023-10-11 NOTE — ED Provider Notes (Addendum)
 [name]    CSN: [id number] Arrival date & time: 10/11/23  1221      History   Chief Complaint Chief Complaint  Patient presents with   Cough    My sinuses and back of throat feel inflamed. - Entered by patient   Sore Throat    HPI Nathan Garcia is a 48 y.o. male.   HPI  Patient presents today with concerns of fatigue, nasal congestion, sore throat and chills for the past day. Reports yesterday after he got home from work he felt very fatigued and tired which is unusual for him  He states he did have some postnasal drainage and sore throat yesterday while at work    Interventions: Claritin daily for several years  Recent sick contacts: He reports that a coworker recently tested positive for strep throat and he was sitting next to this individual   Past Medical History:  Diagnosis Date   Allergy Pediatric   Hay fever   Hypertension    Obstructive sleep apnea 01/06/2017    Patient Active Problem List   Diagnosis Date Noted   Hyperlipidemia 06/12/2019   Tobacco abuse 11/07/2017   Ankle fracture, left 08/14/2017   Obstructive sleep apnea 01/06/2017   Essential hypertension 12/22/2016   Abscess of right axilla 12/22/2016    History reviewed. No pertinent surgical history.     Home Medications    Prior to Admission medications   Medication Sig Start Date End Date Taking? Authorizing Provider  amoxicillin-clavulanate (AUGMENTIN) 875-125 MG tablet Take 1 tablet by mouth every 12 (twelve) hours. 07/29/23   Meliton Rattan, PA  atorvastatin (LIPITOR) 10 MG tablet Take 0.5 tablets (5 mg total) by mouth daily. 08/16/23   Runell Gess, MD  chlorthalidone (HYGROTON) 25 MG tablet Take 1 tablet (25 mg total) by mouth daily. 08/16/23   Runell Gess, MD  Cholecalciferol (VITAMIN D3) 1.25 MG (50000 UT) CAPS Take 1 capsule (1.25 mg total) by mouth once a week. 08/03/23     loratadine (CLARITIN REDITABS) 10 MG dissolvable tablet Take 10 mg by  mouth daily.    [provider]  Potassium Chloride ER 20 MEQ TBCR Take 1 tablet (20 mEq total) by mouth daily. 08/03/23     Potassium Chloride ER 20 MEQ TBCR Take 1 tablet (20 mEq total) by mouth daily. 10/16/23     valsartan (DIOVAN) 160 MG tablet Take 1.5 tablets (240 mg total) by mouth daily. 08/16/23   Runell Gess, MD    Family History History reviewed. No pertinent family history.  Social History Social History   Tobacco Use   Smoking status: Every Day    Current packs/day: 0.50    Average packs/day: 0.5 packs/day for 27.0 years (13.5 ttl pk-yrs)    Types: Cigarettes    Passive exposure: Never   Smokeless tobacco: Never  Vaping Use   Vaping status: Never Used  Substance Use Topics   Alcohol use: Yes    Comment: Close to one fifth of vodka 5 to 6 evenings per week.   Drug use: No     Allergies   Amlodipine   Review of Systems Review of Systems  Constitutional:  Positive for chills and fatigue.  HENT:  Positive for congestion, postnasal drip and sore throat.      Physical Exam Triage Vital Signs ED Triage Vitals  Encounter Vitals Group     BP 10/11/23 1232 124/81     Systolic BP Percentile --  Diastolic BP Percentile --      Pulse Rate 10/11/23 1232 (!) 106     Resp 10/11/23 1232 19     Temp 10/11/23 1232 98.8 F (37.1 C)     Temp Source 10/11/23 1232 Oral     SpO2 10/11/23 1232 96 %     Weight 10/11/23 1231 290 lb (131.5 kg)     Height 10/11/23 1231 6\' 3"  (1.905 m)     Head Circumference --      Peak Flow --      Pain Score 10/11/23 1231 4     Pain Loc --      Pain Education --      Exclude from Growth Chart --    No data found.  Updated Vital Signs BP 124/81 (BP Location: Right Arm)   Pulse (!) 106   Temp 98.8 F (37.1 C) (Oral)   Resp 19   Ht 6\' 3"  (1.905 m)   Wt 290 lb (131.5 kg)   SpO2 96%   BMI 36.25 kg/m   Visual Acuity Right Eye Distance:   Left Eye Distance:   Bilateral Distance:    Right Eye Near:   Left  Eye Near:    Bilateral Near:     Physical Exam Vitals reviewed.  Constitutional:      General: He is awake. He is not in acute distress.    Appearance: Normal appearance. He is well-developed and well-groomed. He is diaphoretic. He is not ill-appearing or toxic-appearing.  HENT:     Head: Normocephalic and atraumatic.     Right Ear: Hearing, tympanic membrane and ear canal normal.     Left Ear: Hearing, tympanic membrane and ear canal normal.     Mouth/Throat:     Lips: Pink.     Mouth: Mucous membranes are moist.     Pharynx: Oropharynx is clear. Uvula midline. No pharyngeal swelling, oropharyngeal exudate, posterior oropharyngeal erythema, uvula swelling or postnasal drip.     Tonsils: No tonsillar exudate or tonsillar abscesses.  Eyes:     General: Lids are normal. Gaze aligned appropriately.     Extraocular Movements: Extraocular movements intact.  Cardiovascular:     Rate and Rhythm: Normal rate and regular rhythm.     Heart sounds: Normal heart sounds.  Pulmonary:     Effort: Pulmonary effort is normal.     Breath sounds: Normal breath sounds. No decreased air movement. No decreased breath sounds, wheezing, rhonchi or rales.  Musculoskeletal:     Cervical back: Normal range of motion and neck supple.  Lymphadenopathy:     Head:     Right side of head: No submental, submandibular or preauricular adenopathy.     Left side of head: No submental, submandibular or preauricular adenopathy.     Cervical:     Right cervical: No superficial cervical adenopathy.    Left cervical: No superficial cervical adenopathy.     Upper Body:     Right upper body: No supraclavicular adenopathy.     Left upper body: No supraclavicular adenopathy.  Skin:    General: Skin is warm.  Neurological:     General: No focal deficit present.     Mental Status: He is alert and oriented to person, place, and time.  Psychiatric:        Mood and Affect: Mood normal.        Behavior: Behavior normal.  Behavior is cooperative.        Thought Content: Thought content  normal.        Judgment: Judgment normal.      UC Treatments / Results  Labs (all labs ordered are listed, but only abnormal results are displayed) Labs Reviewed  POC COVID19/FLU A&B COMBO - Abnormal; Notable for the following components:      Result Value   Covid Antigen, POC Positive (*)    All other components within normal limits  POCT RAPID STREP A (OFFICE)    EKG   Radiology No results found.  Procedures Procedures (including critical care time)  Medications Ordered in UC Medications - No data to display  Initial Impression / Assessment and Plan / UC Course  I have reviewed the triage vital signs and the nursing notes.  Pertinent labs & imaging results that were available during my care of the patient were reviewed by me and considered in my medical decision making (see chart for details).      Final Clinical Impressions(s) / UC Diagnoses   Final diagnoses:  COVID-19  Acute cough  Fever in other diseases   Patient presents today with concerns for chills, fatigue, nasal congestion, postnasal drainage and sore throat.  He reports that this has been ongoing since yesterday.  He reports several recent sick contacts at work who have tested positive for strep throat.  Rapid testing was positive for COVID, negative for strep and flu.  These results were discussed with the patient during his appointment.  Reviewed that he is still within the window for Paxlovid treatment and I would recommend incorporating over-the-counter medications in addition to Paxlovid should he decide to implement antiviral medication.  Reviewed that if he would prefer we can proceed with over-the-counter medications along for symptom management especially since his symptoms appear to be rather mild at this time.  Through shared decision making patient decided he would prefer to use over-the-counter medications for now.  Reviewed  appropriate medications given his previous history of hypertension.  ED return precautions reviewed and provided in after visit summary.  Quarantine and masking recommendations also reviewed during appointment.  Follow-up as needed for progressing or persistent symptoms    Discharge Instructions       You were seen today for concerns for upper respiratory symptoms.  Your rapid testing was positive for COVID.  You tested negative for flu and strep.   After discussing potential treatment options with you we decided not to proceed with Paxlovid administration and look into over-the-counter medications to help control your symptoms until you feel better.  Symptoms can last for 3-10 days with lingering cough and intermittent symptoms lasting weeks after that.  The goal of treatment at this time is to reduce your symptoms and discomfort   I recommend using Robitussin and Mucinex (regular formulations, nothing with decongestants or DM)  You can also use Tylenol for body aches and fever reduction I also recommend adding an antihistamine to your daily regimen This includes medications like Claritin, Allegra, Zyrtec- the generics of these work very well and are usually less expensive I recommend using Flonase nasal spray - 2 puffs twice per day to help with your nasal congestion The antihistamines and Flonase can take a few weeks to provide significant relief from allergy symptoms but should start to provide some benefit soon. You can use a humidifier at night to help with preventing nasal dryness and irritation   If your symptoms are not improving or seem to be getting worse over the next 5 to 7 days you can always return  here to urgent care or you can follow-up with your primary care provider for ongoing management I do recommend quarantining at home for the next 4 days to help prevent transmission to other people.  Once he returned to work on Monday I recommend wearing a mask for the next 5 days to  help prevent transmission to those that you work with.  I have provided you a work note to excuse you.  It is included in your paperwork.  Go to the ER if you begin to have more serious symptoms such as shortness of breath, trouble breathing, loss of consciousness, swelling around the eyes, high fever, severe lasting headaches, vision changes or neck pain/stiffness.       ED Prescriptions   None    PDMP not reviewed this encounter.   Providence Crosby, PA-C 10/11/23 1353    Kameela Leipold, Oswaldo Conroy, PA-C 10/11/23 1354

## 2023-10-16 ENCOUNTER — Other Ambulatory Visit (HOSPITAL_COMMUNITY): Payer: Self-pay

## 2023-11-10 ENCOUNTER — Other Ambulatory Visit (HOSPITAL_COMMUNITY): Payer: Self-pay

## 2023-11-13 ENCOUNTER — Other Ambulatory Visit (HOSPITAL_COMMUNITY): Payer: Self-pay

## 2023-11-16 ENCOUNTER — Other Ambulatory Visit (HOSPITAL_COMMUNITY): Payer: Self-pay

## 2023-12-11 ENCOUNTER — Other Ambulatory Visit: Payer: Self-pay

## 2024-01-09 ENCOUNTER — Other Ambulatory Visit (HOSPITAL_COMMUNITY): Payer: Self-pay

## 2024-01-12 DIAGNOSIS — R6511 Systemic inflammatory response syndrome (SIRS) of non-infectious origin with acute organ dysfunction: Secondary | ICD-10-CM | POA: Diagnosis not present

## 2024-01-12 DIAGNOSIS — E8881 Metabolic syndrome: Secondary | ICD-10-CM | POA: Diagnosis not present

## 2024-01-12 DIAGNOSIS — I10 Essential (primary) hypertension: Secondary | ICD-10-CM | POA: Diagnosis not present

## 2024-01-29 ENCOUNTER — Other Ambulatory Visit: Payer: Self-pay

## 2024-01-29 ENCOUNTER — Other Ambulatory Visit (HOSPITAL_COMMUNITY): Payer: Self-pay

## 2024-02-02 ENCOUNTER — Other Ambulatory Visit (HOSPITAL_COMMUNITY): Payer: Self-pay

## 2024-02-02 MED ORDER — POTASSIUM CHLORIDE ER 20 MEQ PO TBCR
20.0000 meq | EXTENDED_RELEASE_TABLET | Freq: Every day | ORAL | 5 refills | Status: DC
  Filled 2024-02-02: qty 30, 30d supply, fill #0
  Filled 2024-03-05: qty 30, 30d supply, fill #1
  Filled 2024-04-03: qty 30, 30d supply, fill #2
  Filled 2024-04-30: qty 30, 30d supply, fill #3
  Filled 2024-05-30: qty 30, 30d supply, fill #4
  Filled 2024-06-28: qty 30, 30d supply, fill #5

## 2024-02-03 ENCOUNTER — Other Ambulatory Visit (HOSPITAL_COMMUNITY): Payer: Self-pay

## 2024-02-05 ENCOUNTER — Other Ambulatory Visit (HOSPITAL_COMMUNITY): Payer: Self-pay

## 2024-02-19 ENCOUNTER — Other Ambulatory Visit (HOSPITAL_COMMUNITY): Payer: Self-pay

## 2024-02-22 ENCOUNTER — Ambulatory Visit (INDEPENDENT_AMBULATORY_CARE_PROVIDER_SITE_OTHER): Payer: 59 | Admitting: Dermatology

## 2024-02-22 DIAGNOSIS — L723 Sebaceous cyst: Secondary | ICD-10-CM

## 2024-02-22 DIAGNOSIS — B079 Viral wart, unspecified: Secondary | ICD-10-CM

## 2024-02-22 DIAGNOSIS — D485 Neoplasm of uncertain behavior of skin: Secondary | ICD-10-CM

## 2024-02-22 DIAGNOSIS — L729 Follicular cyst of the skin and subcutaneous tissue, unspecified: Secondary | ICD-10-CM

## 2024-02-22 DIAGNOSIS — D489 Neoplasm of uncertain behavior, unspecified: Secondary | ICD-10-CM

## 2024-02-22 DIAGNOSIS — L918 Other hypertrophic disorders of the skin: Secondary | ICD-10-CM | POA: Diagnosis not present

## 2024-02-22 NOTE — Progress Notes (Signed)
 New Patient Visit   Subjective  Nathan Garcia is a 48 y.o. male who presents for the following: Skin Tag  Present on L eyelid and L inner thigh for about 10 years. Denies pain or irritation but would like it to be removed.   C/o spot on R side of scalp. It has previously been removed around 2013 on the back of the scalp. New spot is on the R side of scalp. Denies itchiness, bleeding, or drainage. Would like for this to be evaluated  The following portions of the chart were reviewed this encounter and updated as appropriate: medications, allergies, medical history  Review of Systems:  No other skin or systemic complaints except as noted in HPI or Assessment and Plan.  Objective  Well appearing patient in no apparent distress; mood and affect are within normal limits.  A focused examination was performed of the following areas: Scalp, L eyelid, L inner thigh  Relevant exam findings are noted in the Assessment and Plan.  Left Upper Eyelid Pedunculated verrucous papule  Right Lower Eyelid Pedunculated verrucous papule   Assessment & Plan   1. Fibroepithelial Polyp of the Eye - Assessment: Patient reports a growth on the eye that has been present for approximately 10 years and is growing. It is not significantly obstructing vision. Based on clinical appearance, it is assessed as a fibroepithelial polyp. - Plan:    Perform excision of the lesion    Send specimen for pathological examination    Informed patient of procedure, risks, and benefits    Patient consented to procedure    Skin Tag on Inner Thigh -Assessment: Patient has a skin tag on the inner thigh that is occasionally bothersome. - Plan:    Perform cryotherapy to remove the skin tag    Informed patient of procedure, risks, and benefits    Patient consented to procedure    Instruct patient to apply Aquaphor to treated area for 10-14 days until skin heals    Follow up in 2 weeks to assess healing and  potentially retreat if necessary  . Sebaceous Cyst - Assessment: Patient reports a cyst on back  that appears to be growing. Patient has a history of cyst removal in 2012, with possible recurrence. - Plan:    Refer to Dr. Lowery for evaluation and potential removal of the cyst  Follow-up in 2 weeks to assess healing of treated lesions. Schedule full-body skin cancer screening in October.  NEOPLASM OF UNCERTAIN BEHAVIOR (2) Left Upper Eyelid Skin / nail biopsy Type of biopsy: tangential   Informed consent: discussed and consent obtained   Patient was prepped and draped in usual sterile fashion: Area prepped with alcohol. Anesthesia: the lesion was anesthetized in a standard fashion   Anesthetic:  1% lidocaine w/ epinephrine  1-100,000 buffered w/ 8.4% NaHCO3 Instrument used: scissors   Hemostasis achieved with: pressure and aluminum chloride   Outcome: patient tolerated procedure well   Post-procedure details: wound care instructions given   Post-procedure details comment:  Ointment and small bandage applied  Specimen 1 - Surgical pathology Differential Diagnosis: Rule out verruca   Check Margins: No Right Lower Eyelid Skin / nail biopsy Type of biopsy: tangential   Informed consent: discussed and consent obtained   Patient was prepped and draped in usual sterile fashion: Area prepped with alcohol. Anesthesia: the lesion was anesthetized in a standard fashion   Anesthetic:  1% lidocaine w/ epinephrine  1-100,000 buffered w/ 8.4% NaHCO3 Instrument used: flexible razor blade  Hemostasis achieved with: pressure, aluminum chloride and electrodesiccation   Outcome: patient tolerated procedure well   Post-procedure details: wound care instructions given   Post-procedure details comment:  Ointment and small bandage applied  Specimen 2 - Surgical pathology Differential Diagnosis: rule out verruca  Check Margins: No  Pilar Cyst Exam: Subcutaneous nodule.  Benign-appearing.  Exam most consistent with a pilar cyst. Discussed that a cyst is a benign growth that can grow over time and sometimes get irritated or inflamed. Recommend observation if it is not bothersome. Discussed option of surgical excision to remove it if it is growing, symptomatic, or other changes noted. Please call for new or changing lesions so they can be evaluated.    Return for tbse.  I, Gordan Beams, CMA, am acting as scribe for [employer], DO.   Documentation: I have reviewed the above documentation for accuracy and completeness, and I agree with the above.  Delon Lenis, DO

## 2024-02-22 NOTE — Patient Instructions (Addendum)

## 2024-02-26 ENCOUNTER — Encounter: Payer: Self-pay | Admitting: Dermatology

## 2024-02-26 LAB — SURGICAL PATHOLOGY

## 2024-03-05 ENCOUNTER — Ambulatory Visit: Payer: Self-pay | Admitting: Dermatology

## 2024-03-05 ENCOUNTER — Other Ambulatory Visit (HOSPITAL_COMMUNITY): Payer: Self-pay

## 2024-04-03 ENCOUNTER — Other Ambulatory Visit (HOSPITAL_COMMUNITY): Payer: Self-pay

## 2024-04-15 ENCOUNTER — Other Ambulatory Visit (HOSPITAL_COMMUNITY): Payer: Self-pay

## 2024-04-15 MED ORDER — FLUZONE 0.5 ML IM SUSY
0.5000 mL | PREFILLED_SYRINGE | Freq: Once | INTRAMUSCULAR | 0 refills | Status: AC
  Filled 2024-04-15: qty 0.5, 1d supply, fill #0

## 2024-04-30 ENCOUNTER — Other Ambulatory Visit (HOSPITAL_COMMUNITY): Payer: Self-pay

## 2024-05-15 ENCOUNTER — Other Ambulatory Visit (HOSPITAL_COMMUNITY): Payer: Self-pay

## 2024-05-31 ENCOUNTER — Other Ambulatory Visit (HOSPITAL_COMMUNITY): Payer: Self-pay

## 2024-06-10 ENCOUNTER — Ambulatory Visit: Admitting: Cardiovascular Disease

## 2024-06-28 ENCOUNTER — Other Ambulatory Visit (HOSPITAL_COMMUNITY): Payer: Self-pay

## 2024-07-03 ENCOUNTER — Telehealth: Payer: Self-pay

## 2024-07-03 DIAGNOSIS — I10 Essential (primary) hypertension: Secondary | ICD-10-CM

## 2024-07-03 DIAGNOSIS — E782 Mixed hyperlipidemia: Secondary | ICD-10-CM

## 2024-07-03 NOTE — Telephone Encounter (Signed)
 Pt is scheduled to see Dr. Court on 1/6. Pt checked to see if he needed to have labs drawn prior to his appointment. Lab orders placed per verbal order from Dr. Court. Mychart message sent to pt.

## 2024-07-09 ENCOUNTER — Ambulatory Visit: Attending: Cardiovascular Disease | Admitting: Cardiovascular Disease

## 2024-07-09 ENCOUNTER — Encounter: Payer: Self-pay | Admitting: Cardiovascular Disease

## 2024-07-09 VITALS — BP 130/84 | HR 92 | Ht 75.0 in | Wt 242.6 lb

## 2024-07-09 DIAGNOSIS — Z720 Tobacco use: Secondary | ICD-10-CM | POA: Diagnosis not present

## 2024-07-09 DIAGNOSIS — E782 Mixed hyperlipidemia: Secondary | ICD-10-CM | POA: Diagnosis not present

## 2024-07-09 DIAGNOSIS — I10 Essential (primary) hypertension: Secondary | ICD-10-CM

## 2024-07-09 LAB — LIPID PANEL

## 2024-07-09 NOTE — Patient Instructions (Signed)
 Medication Instructions:  Your physician recommends that you continue on your current medications as directed. Please refer to the Current Medication list given to you today.  *If you need a refill on your cardiac medications before your next appointment, please call your pharmacy*  Follow-Up: At [facility], you and your health needs are our priority.  As part of our continuing mission to provide you with exceptional heart care, our providers are all part of one team.  This team includes your primary Cardiologist (physician) and Advanced Practice Providers or APPs (Physician Assistants and Nurse Practitioners) who all work together to provide you with the care you need, when you need it.  Your next appointment:   12 month(s)  Provider:   Dorn Lesches, MD    Other Instructions

## 2024-07-09 NOTE — Assessment & Plan Note (Signed)
 Mild hyperlipidemia with lipid profile performed 05/09/2023 revealing total cholesterol 177, LDL 91 and HDL 57 on 5 mL grams of atorvastatin  a day.  He does have a very low coronary calcium  score of 3 performed in 2021.

## 2024-07-09 NOTE — Assessment & Plan Note (Signed)
 History of essential hypertension blood pressure measured today at 130/84.  He is on chlorthalidone  and valsartan .

## 2024-07-09 NOTE — Progress Notes (Signed)
 "     07/09/2024 Nathan Garcia   30-Aug-1975  [id number]  Primary Physician Benjamine Aland, MD Primary Cardiologist: Dorn JINNY Lesches MD [facility], [place], [place]  HPI:  Opal Kenroy Timberman is a 49 y.o.  moderately overweight Caucasian male with no children.  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  He was referred by [employer] for coronary vessel evaluation and treatment of his hypertension. I last saw him in the office 06/13/2023. His cardiac risk factors profile is notable for tobacco abuse having smoked one half pack per day for the last 25 years. He is unaware of his lipid profile. He is not diabetic. He does have newly recognized hypertension. There is no family history. He denies chest pain or shortness of breath. He never had a heart attack or stroke. He does admit to dietary indiscretion with regards to salt as well as symptoms compatible with obstructive sleep apnea.     He was seeing Josette in the office for antihypertensive medication titration.  Blood pressures have been under better control since he is been taking his medicines as directed.    Since I saw him a year ago he continues to do well.  He denies chest pain or shortness of breath.  He does continue to smoke however.  He continues to smoke 5 to 6 cigarettes a day.   He did have a coronary calcium  score performed 07/09/2019 which was 3.   Active Medications[1]   Allergies[2]  Social History   Socioeconomic History   Marital status: Single    Spouse name: Not on file   Number of children: Not on file   Years of education: Not on file   Highest education level: Not on file  Occupational History   Not on file  Tobacco Use   Smoking status: Every Day    Current packs/day: 0.50    Average packs/day: 0.5 packs/day for 27.0 years (13.5 ttl pk-yrs)    Types: Cigarettes    Passive exposure: Never   Smokeless tobacco: Never  Vaping Use    Vaping status: Never Used  Substance and Sexual Activity   Alcohol use: Yes    Comment: Close to one fifth of vodka 5 to 6 evenings per week.   Drug use: No   Sexual activity: Not Currently  Other Topics Concern   Not on file  Social History Narrative   ** Merged History Encounter **       Social Drivers of Health   Tobacco Use: High Risk (07/09/2024)   Patient History    Smoking Tobacco Use: Every Day    Smokeless Tobacco Use: Never    Passive Exposure: Never  Financial Resource Strain: Not on file  Food Insecurity: Not on file  Transportation Needs: Not on file  Physical Activity: Not on file  Stress: Not on file  Social Connections: Not on file  Intimate Partner Violence: Not on file  Depression (PHQ2-9): Low Risk (06/10/2022)   Depression (PHQ2-9)    PHQ-2 Score: 1  Alcohol Screen: Not on file  Housing: Not on file  Utilities: Not on file  Health Literacy: Not on file     Review of Systems: General: negative for chills, fever, night sweats or weight changes.  Cardiovascular: negative for chest pain, dyspnea on exertion, edema, orthopnea, palpitations, paroxysmal nocturnal dyspnea or shortness of breath Dermatological: negative for rash Respiratory: negative for cough or wheezing Urologic: negative  for hematuria Abdominal: negative for nausea, vomiting, diarrhea, bright red blood per rectum, melena, or hematemesis Neurologic: negative for visual changes, syncope, or dizziness All other systems reviewed and are otherwise negative except as noted above.    Blood pressure 130/84, pulse 92, height 6' 3 (1.905 m), weight 242 lb 9.6 oz (110 kg), SpO2 94%.  General appearance: alert and no distress Neck: no adenopathy, no carotid bruit, no JVD, supple, symmetrical, trachea midline, and thyroid not enlarged, symmetric, no tenderness/mass/nodules Lungs: clear to auscultation bilaterally Heart: regular rate and rhythm, S1, S2 normal, no murmur, click, rub or  gallop Extremities: extremities normal, atraumatic, no cyanosis or edema Pulses: 2+ and symmetric Skin: Skin color, texture, turgor normal. No rashes or lesions Neurologic: Grossly normal  EKG EKG Interpretation Date/Time:  Tuesday July 09 2024 15:02:22 EST Ventricular Rate:  92 PR Interval:  142 QRS Duration:  82 QT Interval:  360 QTC Calculation: 445 R Axis:   52  Text Interpretation: Normal sinus rhythm Normal ECG When compared with ECG of 13-Jun-2023 13:47, Nonspecific T wave abnormality no longer evident in Inferior leads Confirmed by Court Carrier [phone]) on 07/09/2024 3:04:31 PM    ASSESSMENT AND PLAN:   Essential hypertension History of essential hypertension blood pressure measured today at 130/84.  He is on chlorthalidone  and valsartan .  Tobacco abuse Ongoing tobacco use of 5 to 7 cigarettes a day recalcitrant to risk factor modification.  Hyperlipidemia Mild hyperlipidemia with lipid profile performed 05/09/2023 revealing total cholesterol 177, LDL 91 and HDL 57 on 5 mL grams of atorvastatin  a day.  He does have a very low coronary calcium  score of 3 performed in 2021.     Carrier DOROTHA Court MD FACP,FACC,FAHA, FSCAI 07/09/2024 3:10 PM    [1]  Current Meds  Medication Sig   atorvastatin  (LIPITOR) 10 MG tablet Take 0.5 tablets (5 mg total) by mouth daily.   chlorthalidone  (HYGROTON ) 25 MG tablet Take 1 tablet (25 mg total) by mouth daily.   Cholecalciferol  (VITAMIN D3) 1.25 MG (50000 UT) CAPS Take 1 capsule (1.25 mg total) by mouth once a week.   loratadine (CLARITIN REDITABS) 10 MG dissolvable tablet Take 10 mg by mouth daily.   Potassium Chloride  ER 20 MEQ TBCR Take 1 tablet (20 mEq total) by mouth daily.   valsartan  (DIOVAN ) 160 MG tablet Take 1.5 tablets (240 mg total) by mouth daily.  [2]  Allergies Allergen Reactions   Amlodipine      Low extremity edema   "

## 2024-07-09 NOTE — Assessment & Plan Note (Addendum)
 Ongoing tobacco use of 5 to 7 cigarettes a day recalcitrant to risk factor modification.

## 2024-07-10 ENCOUNTER — Ambulatory Visit: Payer: Self-pay | Admitting: Cardiovascular Disease

## 2024-07-10 LAB — LIPID PANEL
Cholesterol, Total: 162 mg/dL (ref 100–199)
HDL: 91 mg/dL
LDL CALC COMMENT:: 1.8 ratio (ref 0.0–5.0)
LDL Chol Calc (NIH): 60 mg/dL (ref 0–99)
Triglycerides: 54 mg/dL (ref 0–149)
VLDL Cholesterol Cal: 11 mg/dL (ref 5–40)

## 2024-07-10 LAB — COMPREHENSIVE METABOLIC PANEL WITH GFR
ALT: 41 IU/L (ref 0–44)
AST: 24 IU/L (ref 0–40)
Albumin: 4.7 g/dL (ref 4.1–5.1)
Alkaline Phosphatase: 40 IU/L — AB (ref 47–123)
BUN/Creatinine Ratio: 13 (ref 9–20)
BUN: 10 mg/dL (ref 6–24)
Bilirubin Total: 0.8 mg/dL (ref 0.0–1.2)
CO2: 24 mmol/L (ref 20–29)
Calcium: 9.4 mg/dL (ref 8.7–10.2)
Chloride: 95 mmol/L — AB (ref 96–106)
Creatinine, Ser: 0.78 mg/dL (ref 0.76–1.27)
Globulin, Total: 2.4 g/dL (ref 1.5–4.5)
Glucose: 81 mg/dL (ref 70–99)
Potassium: 3.8 mmol/L (ref 3.5–5.2)
Sodium: 136 mmol/L (ref 134–144)
Total Protein: 7.1 g/dL (ref 6.0–8.5)
eGFR: 111 mL/min/1.73

## 2024-07-10 LAB — CBC
Hematocrit: 44.5 % (ref 37.5–51.0)
Hemoglobin: 15 g/dL (ref 13.0–17.7)
MCH: 32.8 pg (ref 26.6–33.0)
MCHC: 33.7 g/dL (ref 31.5–35.7)
MCV: 97 fL (ref 79–97)
Platelets: 227 x10E3/uL (ref 150–450)
RBC: 4.58 x10E6/uL (ref 4.14–5.80)
RDW: 12.4 % (ref 11.6–15.4)
WBC: 4.8 x10E3/uL (ref 3.4–10.8)

## 2024-07-24 ENCOUNTER — Other Ambulatory Visit: Payer: Self-pay

## 2024-07-24 ENCOUNTER — Ambulatory Visit
Admission: RE | Admit: 2024-07-24 | Discharge: 2024-07-24 | Disposition: A | Discharge status: Home / Self Care | Source: Ambulatory Visit | Attending: Student | Admitting: Student

## 2024-07-24 ENCOUNTER — Other Ambulatory Visit (HOSPITAL_COMMUNITY): Payer: Self-pay

## 2024-07-24 VITALS — BP 150/83 | HR 83 | Temp 97.9°F | Resp 17

## 2024-07-24 DIAGNOSIS — J069 Acute upper respiratory infection, unspecified: Secondary | ICD-10-CM | POA: Diagnosis not present

## 2024-07-24 LAB — POCT INFLUENZA A/B
Influenza A, POC: NEGATIVE
Influenza B, POC: NEGATIVE

## 2024-07-24 LAB — POC SOFIA SARS ANTIGEN FIA: SARS Coronavirus 2 Ag: NEGATIVE

## 2024-07-24 MED ORDER — PROMETHAZINE-DM 6.25-15 MG/5ML PO SYRP
5.0000 mL | ORAL_SOLUTION | Freq: Four times a day (QID) | ORAL | 0 refills | Status: AC | PRN
  Filled 2024-07-24 (×2): qty 118, 6d supply, fill #0

## 2024-07-24 MED ORDER — IPRATROPIUM BROMIDE 0.03 % NA SOLN
2.0000 | Freq: Two times a day (BID) | NASAL | 1 refills | Status: AC
  Filled 2024-07-24: qty 30, 43d supply, fill #0

## 2024-07-24 NOTE — ED Provider Notes (Signed)
 [facility]    CSN: [id number] Arrival date & time: 07/24/24  9041      History   Chief Complaint Chief Complaint  Patient presents with   Sore Throat    Inflamed sinuses. - Entered by patient   Nasal Congestion    HPI Nathan Garcia is a 49 y.o. male presenting with sinus pressure and sore throat for 1 day.  Medical history hay fever.  -Endorses: Sinus pressure, nasal congestion, and mild sore throat. Increase in PND. Subjective chills -Denies: cough, n/v/d/abd pain -Duration of symptoms: 1-2 days  -Interventions attempted: Mucinex, robitussen. Hasn't attempted tylenol/ibuprofen  yet.  -The patient denies a history of pulmonary disease. The patient has not required an inhaler in the past. He is a current 1/2 ppd smoker.    HPI  Past Medical History:  Diagnosis Date   Allergy Pediatric   Hay fever   Hypertension    Obstructive sleep apnea 01/06/2017    Patient Active Problem List   Diagnosis Date Noted   Hyperlipidemia 06/12/2019   Tobacco abuse 11/07/2017   Ankle fracture, left 08/14/2017   Obstructive sleep apnea 01/06/2017   Essential hypertension 12/22/2016   Abscess of right axilla 12/22/2016    History reviewed. No pertinent surgical history.     Home Medications    Prior to Admission medications  Medication Sig Start Date End Date Taking? Authorizing Provider  ipratropium (ATROVENT ) 0.03 % nasal spray Place 2 sprays into both nostrils every 12 (twelve) hours. 07/24/24  Yes Jadalee Westcott E, PA-C  promethazine -dextromethorphan (PROMETHAZINE -DM) 6.25-15 MG/5ML syrup Take 5 mLs by mouth 4 (four) times daily as needed for cough. 07/24/24  Yes Arlyss Leita BRAVO, PA-C  atorvastatin  (LIPITOR) 10 MG tablet Take 0.5 tablets (5 mg total) by mouth daily. 08/16/23   Court Dorn PARAS, MD  chlorthalidone  (HYGROTON ) 25 MG tablet Take 1 tablet (25 mg total) by mouth daily. 08/16/23   Court Dorn PARAS, MD  Cholecalciferol  (VITAMIN D3) 1.25 MG (50000  UT) CAPS Take 1 capsule (1.25 mg total) by mouth once a week. 08/03/23     loratadine (CLARITIN REDITABS) 10 MG dissolvable tablet Take 10 mg by mouth daily.    [provider]  Potassium Chloride  ER 20 MEQ TBCR Take 1 tablet (20 mEq total) by mouth daily. 08/03/23     valsartan  (DIOVAN ) 160 MG tablet Take 1.5 tablets (240 mg total) by mouth daily. 08/16/23   Court Dorn PARAS, MD    Family History History reviewed. No pertinent family history.  Social History Social History[1]   Allergies   Amlodipine    Review of Systems Review of Systems  Constitutional:  Positive for chills. Negative for appetite change and fever.  HENT:  Positive for congestion and sore throat. Negative for ear pain, rhinorrhea, sinus pressure and sinus pain.   Eyes:  Negative for redness and visual disturbance.  Respiratory:  Negative for cough, chest tightness, shortness of breath and wheezing.   Cardiovascular:  Negative for chest pain and palpitations.  Gastrointestinal:  Negative for abdominal pain, constipation, diarrhea, nausea and vomiting.  Genitourinary:  Negative for dysuria, frequency and urgency.  Musculoskeletal:  Negative for myalgias.  Neurological:  Negative for dizziness, weakness and headaches.  Psychiatric/Behavioral:  Negative for confusion.   All other systems reviewed and are negative.    Physical Exam Triage Vital Signs ED Triage Vitals [07/24/24 1011]  Encounter Vitals Group     BP (!) 150/83     Girls Systolic BP Percentile  Girls Diastolic BP Percentile      Boys Systolic BP Percentile      Boys Diastolic BP Percentile      Pulse Rate 83     Resp 17     Temp 97.9 F (36.6 C)     Temp Source Oral     SpO2 96 %     Weight      Height      Head Circumference      Peak Flow      Pain Score      Pain Loc      Pain Education      Exclude from Growth Chart    No data found.  Updated Vital Signs BP (!) 150/83 (BP Location: Right Arm)   Pulse 83   Temp 97.9  F (36.6 C) (Oral)   Resp 17   SpO2 96%   Visual Acuity Right Eye Distance:   Left Eye Distance:   Bilateral Distance:    Right Eye Near:   Left Eye Near:    Bilateral Near:     Physical Exam Vitals reviewed.  Constitutional:      General: He is not in acute distress.    Appearance: Normal appearance. He is not ill-appearing.  HENT:     Head: Normocephalic and atraumatic.     Right Ear: Tympanic membrane, ear canal and external ear normal. No tenderness. No middle ear effusion. There is no impacted cerumen. Tympanic membrane is not perforated, erythematous, retracted or bulging.     Left Ear: Tympanic membrane, ear canal and external ear normal. No tenderness.  No middle ear effusion. There is no impacted cerumen. Tympanic membrane is not perforated, erythematous, retracted or bulging.     Nose: Nose normal. No congestion.     Mouth/Throat:     Mouth: Mucous membranes are moist.     Pharynx: Uvula midline. No oropharyngeal exudate or posterior oropharyngeal erythema.     Tonsils: No tonsillar exudate. 0 on the right. 0 on the left.  Eyes:     Extraocular Movements: Extraocular movements intact.     Pupils: Pupils are equal, round, and reactive to light.  Cardiovascular:     Rate and Rhythm: Normal rate and regular rhythm.     Heart sounds: Normal heart sounds.  Pulmonary:     Effort: Pulmonary effort is normal.     Breath sounds: Normal breath sounds. No decreased breath sounds, wheezing, rhonchi or rales.  Abdominal:     Palpations: Abdomen is soft.     Tenderness: There is no abdominal tenderness. There is no guarding or rebound.  Lymphadenopathy:     Cervical: No cervical adenopathy.     Right cervical: No superficial, deep or posterior cervical adenopathy.    Left cervical: No superficial, deep or posterior cervical adenopathy.  Skin:    Comments: No rash   Neurological:     General: No focal deficit present.     Mental Status: He is alert and oriented to person,  place, and time.  Psychiatric:        Mood and Affect: Mood normal.        Behavior: Behavior normal.        Thought Content: Thought content normal.        Judgment: Judgment normal.      UC Treatments / Results  Labs (all labs ordered are listed, but only abnormal results are displayed) Labs Reviewed  POCT INFLUENZA A/B  POC SOFIA SARS ANTIGEN  FIA    EKG   Radiology No results found.  Procedures Procedures (including critical care time)  Medications Ordered in UC Medications - No data to display  Initial Impression / Assessment and Plan / UC Course  I have reviewed the triage vital signs and the nursing notes.  Pertinent labs & imaging results that were available during my care of the patient were reviewed by me and considered in my medical decision making (see chart for details).     Patient is a pleasant 49 y.o. male presenting with viral URI with cough. The patient is afebrile and nontachycardic.  Antipyretic has not been administered today.  -Covid negative -Influenza negative  Will manage symptomatically with Promethazine  DM, Atrovent  nasal spray as below.  Final Clinical Impressions(s) / UC Diagnoses   Final diagnoses:  Viral URI     Discharge Instructions      -Your COVID and influenza tests were negative. -You have a virus, like the common cold.  Viruses typically last 5 to 7 days.  After 7 days, your symptoms should be improving rather than worsening.  If your symptoms improve, and then worsen again, this is when we worry about a sinus infection or a lung infection, and you should return for additional care. -During the day, consider Mucinex DM. Avoid Mucinex D as this can increase your blood pressure -Promethazine  DM cough syrup for congestion/cough. This could make you drowsy, so take at night before bed. -Atrovent  nasal spray every 12 hours while symptoms persist      ED Prescriptions     Medication Sig Dispense Auth. Provider    promethazine -dextromethorphan (PROMETHAZINE -DM) 6.25-15 MG/5ML syrup Take 5 mLs by mouth 4 (four) times daily as needed for cough. 118 mL Lochlyn Zullo E, PA-C   ipratropium (ATROVENT ) 0.03 % nasal spray Place 2 sprays into both nostrils every 12 (twelve) hours. 30 mL Arvon Schreiner E, PA-C      PDMP not reviewed this encounter.     [1]  Social History Tobacco Use   Smoking status: Every Day    Current packs/day: 0.50    Average packs/day: 0.5 packs/day for 27.0 years (13.5 ttl pk-yrs)    Types: Cigarettes    Passive exposure: Never   Smokeless tobacco: Never  Vaping Use   Vaping status: Never Used  Substance Use Topics   Alcohol use: Yes    Comment: Close to one fifth of vodka 5 to 6 evenings per week.   Drug use: No     Arlyss Leita BRAVO, PA-C 07/24/24 1119  "

## 2024-07-24 NOTE — Discharge Instructions (Addendum)
-  Your COVID and influenza tests were negative. -You have a virus, like the common cold.  Viruses typically last 5 to 7 days.  After 7 days, your symptoms should be improving rather than worsening.  If your symptoms improve, and then worsen again, this is when we worry about a sinus infection or a lung infection, and you should return for additional care. -During the day, consider Mucinex DM. Avoid Mucinex D as this can increase your blood pressure -Promethazine  DM cough syrup for congestion/cough. This could make you drowsy, so take at night before bed. -Atrovent  nasal spray every 12 hours while symptoms persist

## 2024-07-24 NOTE — ED Triage Notes (Addendum)
 Pt c/o sinus pressure, congestion, and mild sore throat for 1 day.

## 2024-07-25 ENCOUNTER — Other Ambulatory Visit (HOSPITAL_COMMUNITY): Payer: Self-pay

## 2024-07-26 ENCOUNTER — Other Ambulatory Visit (HOSPITAL_COMMUNITY): Payer: Self-pay

## 2024-07-27 ENCOUNTER — Other Ambulatory Visit (HOSPITAL_COMMUNITY): Payer: Self-pay

## 2024-07-27 MED ORDER — POTASSIUM CHLORIDE ER 20 MEQ PO TBCR
1.0000 | EXTENDED_RELEASE_TABLET | Freq: Every day | ORAL | 0 refills | Status: AC
  Filled 2024-07-27: qty 30, 30d supply, fill #0

## 2024-07-28 ENCOUNTER — Other Ambulatory Visit (HOSPITAL_COMMUNITY): Payer: Self-pay

## 2024-08-02 ENCOUNTER — Other Ambulatory Visit (HOSPITAL_COMMUNITY): Payer: Self-pay

## 2024-08-05 ENCOUNTER — Ambulatory Visit: Admitting: Dermatology

## 2024-08-09 ENCOUNTER — Other Ambulatory Visit (HOSPITAL_COMMUNITY): Payer: Self-pay
